# Patient Record
Sex: Female | Born: 1965 | Race: White | Hispanic: No | Smoking: Former smoker
Health system: Southern US, Community
[De-identification: ages and names within clinical notes are randomized; demographics above are authoritative.]

## PROBLEM LIST (undated history)

## (undated) DIAGNOSIS — Z6841 Body Mass Index (BMI) 40.0 and over, adult: Secondary | ICD-10-CM

## (undated) DIAGNOSIS — K589 Irritable bowel syndrome without diarrhea: Secondary | ICD-10-CM

## (undated) DIAGNOSIS — R112 Nausea with vomiting, unspecified: Secondary | ICD-10-CM

## (undated) DIAGNOSIS — R42 Dizziness and giddiness: Secondary | ICD-10-CM

## (undated) DIAGNOSIS — IMO0002 Reserved for concepts with insufficient information to code with codable children: Secondary | ICD-10-CM

## (undated) DIAGNOSIS — F32A Depression, unspecified: Secondary | ICD-10-CM

## (undated) DIAGNOSIS — Z9889 Other specified postprocedural states: Secondary | ICD-10-CM

## (undated) DIAGNOSIS — H269 Unspecified cataract: Secondary | ICD-10-CM

## (undated) DIAGNOSIS — R51 Headache: Secondary | ICD-10-CM

## (undated) DIAGNOSIS — K219 Gastro-esophageal reflux disease without esophagitis: Secondary | ICD-10-CM

## (undated) DIAGNOSIS — F419 Anxiety disorder, unspecified: Secondary | ICD-10-CM

## (undated) DIAGNOSIS — F329 Major depressive disorder, single episode, unspecified: Secondary | ICD-10-CM

## (undated) DIAGNOSIS — D649 Anemia, unspecified: Secondary | ICD-10-CM

## (undated) DIAGNOSIS — M797 Fibromyalgia: Secondary | ICD-10-CM

## (undated) DIAGNOSIS — G4730 Sleep apnea, unspecified: Secondary | ICD-10-CM

## (undated) DIAGNOSIS — G4700 Insomnia, unspecified: Secondary | ICD-10-CM

## (undated) DIAGNOSIS — T7840XA Allergy, unspecified, initial encounter: Secondary | ICD-10-CM

## (undated) DIAGNOSIS — F988 Other specified behavioral and emotional disorders with onset usually occurring in childhood and adolescence: Secondary | ICD-10-CM

## (undated) DIAGNOSIS — R519 Headache, unspecified: Secondary | ICD-10-CM

## (undated) DIAGNOSIS — D689 Coagulation defect, unspecified: Secondary | ICD-10-CM

## (undated) DIAGNOSIS — M1990 Unspecified osteoarthritis, unspecified site: Secondary | ICD-10-CM

## (undated) HISTORY — DX: Morbid (severe) obesity due to excess calories: E66.01

## (undated) HISTORY — PX: INNER EAR SURGERY: SHX679

## (undated) HISTORY — PX: OTHER SURGICAL HISTORY: SHX169

## (undated) HISTORY — PX: COLONOSCOPY: SHX174

## (undated) HISTORY — DX: Dizziness and giddiness: R42

## (undated) HISTORY — DX: Anemia, unspecified: D64.9

## (undated) HISTORY — DX: Depression, unspecified: F32.A

## (undated) HISTORY — DX: Body Mass Index (BMI) 40.0 and over, adult: Z684

## (undated) HISTORY — PX: TONSILLECTOMY: SUR1361

## (undated) HISTORY — DX: Anxiety disorder, unspecified: F41.9

## (undated) HISTORY — PX: ABLATION: SHX5711

## (undated) HISTORY — DX: Allergy, unspecified, initial encounter: T78.40XA

## (undated) HISTORY — DX: Unspecified osteoarthritis, unspecified site: M19.90

## (undated) HISTORY — PX: ABDOMINAL HYSTERECTOMY: SHX81

## (undated) HISTORY — DX: Reserved for concepts with insufficient information to code with codable children: IMO0002

## (undated) HISTORY — PX: ANKLE SURGERY: SHX546

## (undated) HISTORY — DX: Sleep apnea, unspecified: G47.30

## (undated) HISTORY — DX: Coagulation defect, unspecified: D68.9

## (undated) HISTORY — DX: Major depressive disorder, single episode, unspecified: F32.9

## (undated) HISTORY — DX: Unspecified cataract: H26.9

## (undated) HISTORY — PX: FRACTURE SURGERY: SHX138

## (undated) HISTORY — PX: TUBAL LIGATION: SHX77

---

## 2005-12-01 ENCOUNTER — Emergency Department (HOSPITAL_COMMUNITY): Admission: EM | Admit: 2005-12-01 | Discharge: 2005-12-01 | Payer: Self-pay | Admitting: Family Medicine

## 2006-01-23 ENCOUNTER — Ambulatory Visit (HOSPITAL_COMMUNITY): Admission: RE | Admit: 2006-01-23 | Discharge: 2006-01-23 | Payer: Self-pay | Admitting: Pulmonary Disease

## 2007-01-17 ENCOUNTER — Emergency Department (HOSPITAL_COMMUNITY): Admission: EM | Admit: 2007-01-17 | Discharge: 2007-01-17 | Payer: Self-pay | Admitting: Emergency Medicine

## 2010-04-01 ENCOUNTER — Other Ambulatory Visit
Admission: RE | Admit: 2010-04-01 | Discharge: 2010-04-01 | Payer: Self-pay | Source: Home / Self Care | Admitting: Obstetrics and Gynecology

## 2010-05-01 ENCOUNTER — Encounter: Payer: Self-pay | Admitting: Obstetrics and Gynecology

## 2010-05-01 ENCOUNTER — Encounter: Payer: Self-pay | Admitting: Pulmonary Disease

## 2010-06-14 ENCOUNTER — Emergency Department (HOSPITAL_COMMUNITY)
Admission: EM | Admit: 2010-06-14 | Discharge: 2010-06-15 | Disposition: A | Discharge status: Home / Self Care | Payer: Medicaid Other | Attending: Emergency Medicine | Admitting: Emergency Medicine

## 2010-06-14 ENCOUNTER — Emergency Department (HOSPITAL_COMMUNITY): Payer: Medicaid Other

## 2010-06-14 DIAGNOSIS — Y92009 Unspecified place in unspecified non-institutional (private) residence as the place of occurrence of the external cause: Secondary | ICD-10-CM | POA: Insufficient documentation

## 2010-06-14 DIAGNOSIS — S60229A Contusion of unspecified hand, initial encounter: Secondary | ICD-10-CM | POA: Insufficient documentation

## 2010-06-14 DIAGNOSIS — Z79899 Other long term (current) drug therapy: Secondary | ICD-10-CM | POA: Insufficient documentation

## 2010-06-14 DIAGNOSIS — M79609 Pain in unspecified limb: Secondary | ICD-10-CM | POA: Insufficient documentation

## 2010-06-14 DIAGNOSIS — W010XXA Fall on same level from slipping, tripping and stumbling without subsequent striking against object, initial encounter: Secondary | ICD-10-CM | POA: Insufficient documentation

## 2010-07-01 ENCOUNTER — Other Ambulatory Visit (HOSPITAL_COMMUNITY): Payer: Self-pay

## 2010-07-02 ENCOUNTER — Encounter (HOSPITAL_COMMUNITY): Payer: Medicaid Other

## 2010-07-02 ENCOUNTER — Other Ambulatory Visit: Payer: Self-pay | Admitting: Obstetrics and Gynecology

## 2010-07-02 LAB — COMPREHENSIVE METABOLIC PANEL
ALT: 21 U/L (ref 0–35)
Albumin: 3 g/dL — ABNORMAL LOW (ref 3.5–5.2)
Alkaline Phosphatase: 85 U/L (ref 39–117)
BUN: 5 mg/dL — ABNORMAL LOW (ref 6–23)
Chloride: 102 mEq/L (ref 96–112)
Glucose, Bld: 133 mg/dL — ABNORMAL HIGH (ref 70–99)
Potassium: 4.2 mEq/L (ref 3.5–5.1)
Sodium: 135 mEq/L (ref 135–145)
Total Bilirubin: 0.4 mg/dL (ref 0.3–1.2)
Total Protein: 5.8 g/dL — ABNORMAL LOW (ref 6.0–8.3)

## 2010-07-02 LAB — CBC
Hemoglobin: 13.5 g/dL (ref 12.0–15.0)
MCH: 29.9 pg (ref 26.0–34.0)
MCV: 86.3 fL (ref 78.0–100.0)
Platelets: 284 10*3/uL (ref 150–400)
RBC: 4.51 MIL/uL (ref 3.87–5.11)
WBC: 6.8 10*3/uL (ref 4.0–10.5)

## 2010-07-02 LAB — SURGICAL PCR SCREEN
MRSA, PCR: NEGATIVE
Staphylococcus aureus: NEGATIVE

## 2010-07-04 NOTE — H&P (Addendum)
Annette Herrera, Annette Herrera               ACCOUNT NO.:  [id number]  MEDICAL RECORD NO.:  [id number]           PATIENT TYPE:  LOCATION:                                 FACILITY:  PHYSICIAN:  Tilda Burrow, M.D. DATE OF BIRTH:  Nov 25, 1965  DATE OF ADMISSION: DATE OF DISCHARGE:  LH                             HISTORY & PHYSICAL   ADMITTING DIAGNOSES: 1. Desire for elective permanent sterilization. 2. Expired Implanon, scheduled for removal. 3. History of menorrhagia, desiring endometrial ablation.  HISTORY OF PRESENT ILLNESS:  This 45 year old female gravida 4, para 2-0- 1-2 is admitted at this time for elective permanent sterilization by Falope-Rings to be accompanied by Implanon removal and endometrial ablation.  Annette Herrera has been seen in our office recently for annual exam.  She has been using an Implanon for the last 3 years with 3-year expiration date, February 2012.  After discussion treatment options, she desires permanent sterilization rather than use of Mirena IUD or otheralternative methods.  She has a concern due to history of heavy menses and after discussion, desires to proceed with endometrial ablation concurrently.  Cycles are currently lasting about 5 days with severe cramps even when on Implanon during 3 years.  Recently, menses are increased in severity and discomfort.  Transvaginal ultrasound has been performed in the office to assess the endometrium.  The endometrial stripe is only 2 mm in thickness, anteflexed, otherwise normal size uterus and small ovaries.  Endometrial biopsy is therefore deferred.  The patient understands the permanency of requested sterilization, and this is her desire.  PAST MEDICAL HISTORY:  Positive for gestational diabetes, diet controlled.  SURGICAL HISTORY:  Tonsillectomy, mastoidectomy, right ankle surgery, finger surgery.  She has questionable history of fibromyalgia. Hemoglobin A1c remains good at 6.1.  FAMILY HISTORY:  Positive  for hypertension, diabetes, thyroid problems, and cancer.  SOCIAL HISTORY:  She is nonsmoker and does drink alcohol.  Denies recreational drugs.  She is self-employed.  REVIEW OF SYSTEMS:  Negative, other than chronic achy discomfort.  PHYSICAL EXAMINATION:  GENERAL:  An obese Caucasian female, weight 311.86 pounds from over the last 2 months.  BMI is still greater than 50.  Height 65 inches.  Pulse 65, blood pressure 130/80. HEENT:  Pupils equal, round, and reactive. NECK:  Supple. CHEST:  Clear to auscultation. ABDOMEN:  Nontender. BREASTS:  Deferred at this time, recently normal on annual exam. LUNGS:  Clear. ABDOMEN:  Obese without masses. EXTERNAL GENITALIA:  Multiparous.  Vaginal exam, normal uterus with normal size on difficulty exam limited by the patient's obese body habitus.  Adnexa nontender.  Recent ultrasound shows an anteflexed uterus, thin endometrial stripe, 2 mm with no adnexal abnormalities or tenderness.  IMPRESSION: 1. Desire for elective permanent sterilization. 2. Desire for Implanon removal. 3. Menorrhagia, desiring endometrial ablation. 4. Morbid obesity, BMI greater than 50.  PLAN:  Laparoscopic tubal sterilization, Falope-Rings, endometrial ablation, Implanon removal, July 06, 2010.     Tilda Burrow, M.D.     JVF/MEDQ  D:  06/30/2010  T:  06/30/2010  Job:  [id number]  cc:   [facility]  Electronically Signed by  Christin Bach M.D. on 07/04/2010 09:06:00 PM

## 2010-07-05 ENCOUNTER — Ambulatory Visit (HOSPITAL_COMMUNITY)
Admission: RE | Admit: 2010-07-05 | Discharge: 2010-07-05 | Disposition: A | Discharge status: Home / Self Care | Payer: Medicaid Other | Source: Ambulatory Visit | Attending: Obstetrics and Gynecology | Admitting: Obstetrics and Gynecology

## 2010-07-05 DIAGNOSIS — Z4689 Encounter for fitting and adjustment of other specified devices: Secondary | ICD-10-CM | POA: Insufficient documentation

## 2010-07-05 DIAGNOSIS — N920 Excessive and frequent menstruation with regular cycle: Secondary | ICD-10-CM | POA: Insufficient documentation

## 2010-07-05 DIAGNOSIS — Z01812 Encounter for preprocedural laboratory examination: Secondary | ICD-10-CM | POA: Insufficient documentation

## 2010-07-05 DIAGNOSIS — Z302 Encounter for sterilization: Secondary | ICD-10-CM | POA: Insufficient documentation

## 2010-07-19 NOTE — Op Note (Signed)
NAMEGIULIETTA, Annette Herrera               ACCOUNT NO.:  [id number]  MEDICAL RECORD NO.:  [id number]           PATIENT TYPE:  O  LOCATION:  [facility]                          FACILITY:  [facility]  PHYSICIAN:  Tilda Burrow, M.D. DATE OF BIRTH:  [birth year]  DATE OF PROCEDURE:  07/05/2010 DATE OF DISCHARGE:                              OPERATIVE REPORT   PREOPERATIVE DIAGNOSES: 1. Desire for elective permanent sterilization. 2. History of menorrhagia. 3. Implanon expired, for scheduled removal. 4. Morbid obesity.  POSTOPERATIVE DIAGNOSES: 1. Desire for elective permanent sterilization. 2. History of menorrhagia. 3. Implanon expired, for scheduled removal. 4. Midline uterine septum, resected.  PROCEDURE: 1. Laparoscopic tubal sterilization Falope rings. 2. Hysteroscopy with resection of midline intrauterine septum     endometrial ablation. 3. Removal of Implanon right upper arm.  SURGEON:  Tilda Burrow, MD ASSISTANT:  Amie Critchley, CSTANESTHESIA:  General. COMPLICATIONS:  None. FINDINGS:  Normal tubes and ovaries.  DETAILS OF PROCEDURE:  Part I The patient was taken to the operating room, prepped and draped in the usual fashion for combined abdominal and vaginal procedure with the Hulka tenaculum attached to the uterus for manipulation.  Attention was first directed to the abdomen where an infraumbilical vertical 1-cm skin incision was made as well as a suprapubic 1-cm incision.  Catheter had been placed in the bladder temporarily.  Infraumbilical vertical incision was used to place Veress needle which resulted in pneumoperitoneum under 11 mmHg pressure.  The laparoscopic trocar was introduced with a 5-mm camera using direct visualization.  The intra- abdominal content showed no evidence of trauma or bleeding.  Suprapubic trocar was placed under direct visualization and an 8-mm trocar and Falope rings were applied on each tube has shown in photo documents as 1 and 2.  Marcaine was  injected in the incarcerated knuckle of tube and in the mesosalpinx beneath the tube for pain control.  Deflation of the abdomen followed, with subcuticular 4-0 Vicryl, closure of the skin after placement of 120 mL of saline to help with evacuation of the gas from the abdomen.  Part II Hysteroscopy and endometrial ablation:  The uterus was sounded to 8 cm, dilated to 25-French, allowing introduction of rigid 30-degree hysteroscope.  Photos 3 and 4 showed the tubal ostia on what appeared initially to be the side of a septate uterus.  On further photos we may be able to see that there was a midline septum and the lower portion of the uterus.  This was transected and photo 8 shows the resected results. Endometrial cavity was very thinned and denuded with no residual tissue and no areas of suspicion.  There was no tissue sample once with curettage.  There was no tissue sample.  Gynecare ThermaChoice III endometrial ablation device was inserted, insufflated with 8 mL of D5W and 8-minute thermal ablation sequence completed and fluid recovered.  Marcaine was injected around the cervix.  Paracervical block at 3 and 9 o'clock using 22 mL with 0.52% Marcaine.  Procedure was completed and instruments were removed.  Part III Implanon was removed in sterile fashion from the right arm without difficulty through a small  5 mm incision, after prepping and draping of the arm in standard and surgical fashion Steri-Strips were placed to all incisions and the patient went to recovery room in good condition.     Tilda Burrow, M.D.     JVF/MEDQ  D:  07/05/2010  T:  07/05/2010  Job:  [id number]  Electronically Signed by Christin Bach M.D. on 07/19/2010 05:27:32 PM

## 2010-08-27 NOTE — Procedures (Signed)
[name], Annette Herrera               ACCOUNT NO.:  [id number]   MEDICAL RECORD NO.:  [id number]          PATIENT TYPE:  OUT   LOCATION:  [facility]                           FACILITY:  [facility]   PHYSICIAN:  Edward L. Juanetta Gosling, M.D.DATE OF BIRTH:  1965/06/09   DATE OF PROCEDURE:  DATE OF DISCHARGE:  01/23/2006                              PULMONARY FUNCTION TEST   1. Spirometry showed essentially normal with mild airflow obstruction seen      in the smaller airways.  2. Lung volumes are normal.  3. VLCO is mildly reduced.      Edward L. Juanetta Gosling, M.D.  Electronically Signed     ELH/MEDQ  D:  01/24/2006  T:  01/26/2006  Job:  [id number]

## 2013-04-27 ENCOUNTER — Emergency Department (HOSPITAL_COMMUNITY)
Admission: EM | Admit: 2013-04-27 | Discharge: 2013-04-27 | Disposition: A | Discharge status: Home / Self Care | Payer: Medicaid Other | Attending: Emergency Medicine | Admitting: Emergency Medicine

## 2013-04-27 ENCOUNTER — Encounter (HOSPITAL_COMMUNITY): Payer: Self-pay | Admitting: Emergency Medicine

## 2013-04-27 DIAGNOSIS — K0889 Other specified disorders of teeth and supporting structures: Secondary | ICD-10-CM

## 2013-04-27 DIAGNOSIS — Z791 Long term (current) use of non-steroidal anti-inflammatories (NSAID): Secondary | ICD-10-CM | POA: Insufficient documentation

## 2013-04-27 DIAGNOSIS — K089 Disorder of teeth and supporting structures, unspecified: Secondary | ICD-10-CM | POA: Insufficient documentation

## 2013-04-27 MED ORDER — IBUPROFEN 800 MG PO TABS: 800.0000 mg | ORAL_TABLET | Freq: Three times a day (TID) | ORAL | Status: DC

## 2013-04-27 MED ORDER — OXYCODONE-ACETAMINOPHEN 5-325 MG PO TABS: 2.0000 | ORAL_TABLET | ORAL | Status: DC | PRN

## 2013-04-27 MED ORDER — PENICILLIN V POTASSIUM 500 MG PO TABS: 500.0000 mg | ORAL_TABLET | Freq: Four times a day (QID) | ORAL | Status: AC

## 2013-04-27 NOTE — ED Provider Notes (Signed)
CSN: [id number]     Arrival date & time 04/27/13  0244 History   First MD Initiated Contact with Patient 04/27/13 [phone]     Chief Complaint  Patient presents with  . Dental Pain   (Consider location/radiation/quality/duration/timing/severity/associated sxs/prior Treatment) HPI History provided by patient. Right upper dental pain on and off for the last few weeks, worse over the last few days. No fevers or chills. No difficulty breathing or swallowing. Pain is sharp in quality and moderate to severe, worse with chewing. Patient had an old prescription for amoxicillin which she took tonight. She's been taking Motrin at home without relief. Unable to sleep tonight. Has been trying to get a hold of a dentist but does not have one. No known alleviating factors.  History reviewed. No pertinent past medical history. Past Surgical History  Procedure Laterality Date  . Tonsillectomy    . Ablation     No family history on file. History  Substance Use Topics  . Smoking status: Never Smoker   . Smokeless tobacco: Not on file  . Alcohol Use: Yes   OB History   Grav Para Term Preterm Abortions TAB SAB Ect Mult Living                 Review of Systems  Constitutional: Negative for fever and chills.  HENT: Positive for dental problem. Negative for ear pain and facial swelling.   Eyes: Negative for pain.  Respiratory: Negative for shortness of breath.   Cardiovascular: Negative for chest pain.  Gastrointestinal: Negative for abdominal pain.  Genitourinary: Negative for dysuria.  Musculoskeletal: Negative for back pain, neck pain and neck stiffness.  Skin: Negative for rash.  Neurological: Negative for headaches.  All other systems reviewed and are negative.    Allergies  Review of patient's allergies indicates no known allergies.  Home Medications   Current Outpatient Rx  Name  Route  Sig  Dispense  Refill  . ibuprofen (ADVIL,MOTRIN) 200 MG tablet   Oral   Take 200 mg by mouth  every 6 (six) hours as needed.         . naproxen (NAPROSYN) 250 MG tablet   Oral   Take by mouth 2 (two) times daily with a meal.          BP 149/82  Pulse 72  Temp(Src) 97.9 F (36.6 C) (Oral)  Resp 26  SpO2 100%  LMP 04/07/2013 Physical Exam  Constitutional: She is oriented to person, place, and time. She appears well-developed and well-nourished.  HENT:  Head: Normocephalic and atraumatic.  Uvula midline. No trismus. Poor dentition. Tenderness right upper first molar without associated gingival swelling or fluctuance. No associated facial swelling or erythema. No tenderness over maxillary sinuses  Eyes: EOM are normal. Pupils are equal, round, and reactive to light.  Neck: Neck supple.  Cardiovascular: Regular rhythm and intact distal pulses.   Pulmonary/Chest: Effort normal. No respiratory distress.  Musculoskeletal: Normal range of motion. She exhibits no edema.  Neurological: She is alert and oriented to person, place, and time.  Skin: Skin is warm and dry.    ED Course  Dental Date/Time: 04/27/2013 4:04 AM Performed by: Sunnie Nielsen Authorized by: Sunnie Nielsen Consent: Verbal consent obtained. Risks and benefits: risks, benefits and alternatives were discussed Consent given by: patient Patient understanding: patient states understanding of the procedure being performed Patient consent: the patient's understanding of the procedure matches consent given Procedure consent: procedure consent matches procedure scheduled Required items: required blood products, implants,  devices, and special equipment available Patient identity confirmed: verbally with patient Time out: Immediately prior to procedure a "time out" was called to verify the correct patient, procedure, equipment, support staff and site/side marked as required. Local anesthesia used: yes Anesthesia: local infiltration Local anesthetic: bupivacaine 0.5% without epinephrine Anesthetic total: 1.8 ml Patient  tolerance: Patient tolerated the procedure well with no immediate complications. Comments: Local injection of right upper first molar with adequate anesthesia obtained   (including critical care time) Labs Review Labs Reviewed - No data to display Imaging Review No results found.  Plan discharge home with prescription for penicillin, Motrin, Percocet. Outpatient referrals provided.  Return precautions verbalized as understood  MDM  Diagnosis: Dental pain  Treated with dental block as above Dental referral provided Vital signs nurse's notes reviewed and considered   Sunnie NielsenBrian Cortasia Screws, MD 04/27/13 0405

## 2013-04-27 NOTE — Discharge Instructions (Signed)
Dental Pain °A tooth ache may be caused by cavities (tooth decay). Cavities expose the nerve of the tooth to air and hot or cold temperatures. It may come from an infection or abscess (also called a boil or furuncle) around your tooth. It is also often caused by dental caries (tooth decay). This causes the pain you are having. °DIAGNOSIS  °Your caregiver can diagnose this problem by exam. °TREATMENT  °· If caused by an infection, it may be treated with medications which kill germs (antibiotics) and pain medications as prescribed by your caregiver. Take medications as directed. °· Only take over-the-counter or prescription medicines for pain, discomfort, or fever as directed by your caregiver. °· Whether the tooth ache today is caused by infection or dental disease, you should see your dentist as soon as possible for further care. °SEEK MEDICAL CARE IF: °The exam and treatment you received today has been provided on an emergency basis only. This is not a substitute for complete medical or dental care. If your problem worsens or new problems (symptoms) appear, and you are unable to meet with your dentist, call or return to this location. °SEEK IMMEDIATE MEDICAL CARE IF:  °· You have a fever. °· You develop redness and swelling of your face, jaw, or neck. °· You are unable to open your mouth. °· You have severe pain uncontrolled by pain medicine. °MAKE SURE YOU:  °· Understand these instructions. °· Will watch your condition. °· Will get help right away if you are not doing well or get worse. °Document Released: 03/28/2005 Document Revised: 06/20/2011 Document Reviewed: 11/14/2007 °ExitCare® Patient Information ©2014 [employer]. °  Emergency Department Resource Guide °1) Find a Doctor and Pay Out of Pocket °Although you won't have to find out who is covered by your insurance plan, it is a good idea to ask around and get recommendations. You will then need to call the office and see if the doctor you have chosen will  accept you as a new patient and what types of options they offer for patients who are self-pay. Some doctors offer discounts or will set up payment plans for their patients who do not have insurance, but you will need to ask so you aren't surprised when you get to your appointment. ° °2) Contact Your Local Health Department °Not all health departments have doctors that can see patients for sick visits, but many do, so it is worth a call to see if yours does. If you don't know where your local health department is, you can check in your phone book. The CDC also has a tool to help you locate your state's health department, and many state websites also have listings of all of their local health departments. ° °3) Find a Walk-in Clinic °If your illness is not likely to be very severe or complicated, you may want to try a walk in clinic. These are popping up all over the country in pharmacies, drugstores, and shopping centers. They're usually staffed by nurse practitioners or physician assistants that have been trained to treat common illnesses and complaints. They're usually fairly quick and inexpensive. However, if you have serious medical issues or chronic medical problems, these are probably not your best option. ° °No Primary Care Doctor: °- Call [facility] at  832-8000 - they can help you locate a primary care doctor that  accepts your insurance, provides certain services, etc. °- Physician Referral Service- 1-800-533-3463 ° °Chronic Pain Problems: °Organization           Address  Phone   Notes  °[facility]  (336) 297-2271 Patients need to be referred by their primary care doctor.  ° °Medication Assistance: °Organization         Address  Phone   Notes  °[place] [facility] [address] °[place], [place] [address] (336) 641-8030 --Must be a resident of [place] °-- Must have NO insurance coverage whatsoever (no Medicaid/ Medicare, etc.) °-- The pt.  MUST have a primary care doctor that directs their care regularly and follows them in the community °  °[facility]  (866) 331-1348   °[employer]  (888) 892-1162   ° °Agencies that provide inexpensive medical care: °Organization         Address  Phone   Notes  °[facility]  (336) 832-8035   °[facility]    (336) 832-7272   °[facility] [address] °[place], [place] [address] (336) 832-4777   °[facility] [address], °[place] (336) 271-4999   °[facility]    (336) 373-0678   °[facility]    (336) 272-1050   °[facility] ° [address], [place] Phone:  (336) 832-4444, Fax:  (336) 832-4440 Hours of Operation:  9 am - 6 pm, M-F.  Also accepts Medicaid/Medicare and self-pay.  °[facility] ° [address], [place] Phone: (336) 832-3150, Fax: (336) 832-3151. Hours of Operation:  8:30 am - 5:30 pm, M-F.  Also accepts Medicaid and self-pay.  °[facility] [place] [address], [place] Phone: (336) 878-6027   °[facility] [address], [place], [place] (336)723-1848, Ext. 123 Mondays & Thursdays: 7-9 AM.  First 15 patients are seen on a first come, first serve basis. °  ° °Medicaid-accepting [place] Providers: ° °Organization         Address  Phone   Notes  °[facility] [address], [place] (336) 641-2100 Also accepts self-pay patients.  °[facility] [address], [place] ° (336) 856-9996   °[facility] [address], [place] (336) 288-8857   °[facility] [address] [place] [address], [place] (336) 299-7000   °Veita Bland [address], [place]  ° (336) 373-1557 Only accepts [place] Access Medicaid patients after they have their name applied to their card.  ° °Self-Pay (no insurance) in  [place]: ° °Organization         Address  Phone   Notes  °Sickle Cell Patients, [facility] [address], [place] (336) 832-1970   °[facility] [address], [place] (336) 832-4400   °[facility] ° [address], [place] (336) 992-4800   °[facility]/Dr. Osei-Bonsu ° [address] [place] [address], [place] or [address], [place] (336) 841-8500 Phone number for both [place] and [place] locations is the same.  °[facility] [address], [place] (336) 299-0000   °[facility] [address] [place] [address], [place] or [address] (336) 852-7530 °(336) 878-2260   °[facility] [address], [place] (336) 350-1642, phone; (336) 294-5005, fax Sees patients 1st and 3rd Saturday of every month.  Must not qualify   for public or private insurance (i.e. Medicaid, Medicare, [place] Health Choice, Veterans' Benefits) • Household income should be no more than 200% of the poverty level •The clinic cannot treat you if you are pregnant or think you are pregnant • Sexually transmitted diseases are not treated at the clinic.  ° °Dental Care: °Organization         Address  Phone  Notes  °[facility] [facility] [address], [place] (336) 641-6152 Accepts children up to age 21 who are enrolled in Medicaid or [place] Health Choice; pregnant women with a Medicaid card; and children who have applied for Medicaid or [place] Health Choice, but were declined, whose parents can pay a reduced fee at time of service.  °[facility] [place]  [address], [place] (336) 641-7733 Accepts children up to age 21 who are enrolled in Medicaid or [place] Health Choice; pregnant women with a Medicaid card; and children who have applied for Medicaid or [place] Health Choice, but were declined, whose parents  can pay a reduced fee at time of service.  °[facility] ° [address], [place] (336) 641-4533 Patients are seen by appointment only. Walk-ins are not accepted. [facility] will see patients 18 years of age and older. °Monday - Tuesday (8am-5pm) °Most Wednesdays (8:30-5pm) °$30 per visit, cash only  °[facility] ° [address], [place] (336) 641-4533 Patients are seen by appointment only. Walk-ins are not accepted. [facility] will see patients 18 years of age and older. °One Wednesday Evening (Monthly: Volunteer Based).  $30 per visit, cash only  °[facility]  (919) 537-3737 for adults; Children under age 4, call Graduate Pediatric Dentistry at (919) [phone]. Children aged 4-14, please call (919) [phone] to request a pediatric application. ° Dental services are provided in all areas of dental care including fillings, crowns and bridges, complete and partial dentures, implants, gum treatment, root canals, and extractions. Preventive care is also provided. Treatment is provided to both adults and children. °Patients are selected via a lottery and there is often a waiting list. °  °[facility] [address], °[place] ° (336) 763-8833 [email or web address] °  °[facility] [address], [place], [place] (336)723-1848, Ext. 123 Second and Fourth Thursday of each month, opens at 6:30 AM; Clinic ends at 9 AM.  Patients are seen on a first-come first-served basis, and a limited number are seen during each clinic.  ° °[facility] ° [address], [place], [place] (336) 723-7904   Eligibility Requirements °You must have lived in [place], [place], or [place] counties for at least the last three months. °  You cannot be eligible for state or federal sponsored healthcare insurance, including Veterans Administration, Medicaid, or Medicare. °  You generally cannot be eligible for healthcare  insurance through your employer.  °  How to apply: °Eligibility screenings are held every Tuesday and Wednesday afternoon from 1:00 pm until 4:00 pm. You do not need an appointment for the interview!  °[facility] [address], [place], [place] 336-631-2330   °[facility] [facility]  336-342-8273   °[facility]  336-703-3100   °[facility]  336-570-6415   ° °Behavioral Health Resources in the Community: °Intensive Outpatient Programs °Organization         Address  Phone    Notes  °[facility] [address], [place], [place] 336-878-6098   °[facility] [address], [place], [place] 336-832-9800   °[facility] [address], [place], [place] ° 336-882-2125   °[facility] [address],  °[place], [place] 1-800-853-5163 or 336-641-4981   °Substance Abuse Resources °Organization         Address  Phone  Notes  °[facility]  336-882-2125   °[facility]  336-784-9470   °[facility]  336-285-9073   °[facility]  336-845-3988   °Residential & Outpatient Substance Abuse Program  1-800-659-3381   °Psychological Services °Organization         Address  Phone  Notes  °[facility]  336- 832-9600   °[facility]  336- 378-7881   °[facility] [address], [place] 1-800-853-5163 or 336-641-4981   ° °Mobile Crisis Teams °Organization         Address  Phone  Notes  °[facility], Mobile Crisis Care Unit  1-877-626-1772   °Assertive °[facility] ° [address] [place], [place] 336-834-9664   °Sharon DeEsch [address] °[place] 336-554-5454   ° °Self-Help/Support Groups °Organization         Address  Phone             Notes  °[facility] - variety of support groups  336- 373-1402 Call for more information  °Narcotics Anonymous (NA),  [facility] [address], °[place]  2 meetings at this location  ° °Residential Treatment Programs °Organization         Address  Phone  Notes  °[facility] [address],    °[place]  1-866-801-8205   °[facility] ° [address], [place], [place] 704-293-8524   °[facility] [address], [place] 336-845-3988 Admissions: 8am-3pm M-F  °[facility] [address]    °[place], [place] 336-841-1104   °[facility] [address], [place], [place] 336-379-7146   °[facility] [address]  °[place], [place] 336-285-9073   °[facility] - Intensive Outpatient [address], [place], [place] 336-852-3033   °[facility] [address]  °[place], [place] 1-877-615-2722 or 336-784-9470   °[facility] [address] [place], [place] 336-227-7417 Accepts Medicaid  °[facility] [address]  °[place] 1-800-659-3381 Substance Abuse/Addiction Treatment  ° °[place] Behavioral Health Resources °Organization         Address  Phone  Notes  °[facility]  (888) 581-9988   °Julie Brannon, PhD [address] [place], [place]   (336) 349-5553 or (336) 951-0000   °[facility]   [address] °[place],  (336) 349-4454   °[facility] [address], [place],  (336) 342-8316 Insurance/Medicaid/sponsorship through [facility]  °[facility] [address]                                    [place],  (336) 342-8316 Therapy/tele-psych/case  °[facility] [address]  ° [place],  (336) 349-2233    °Dr. Arfeen  (336) 349-4544   °[facility] [place]  [employer] [facility] [place]   [facility] 1) [address], [place] °2) [address], [place] °3)  [address], [place] (336) 349-3220 °(336) 342-7768 ° °(336) 342-8140     °[place] [facility] (336) 342-1394 or (336) 342-3537 (After Hours)    ° °   °

## 2013-04-27 NOTE — ED Notes (Signed)
Right upper dental pain, states she has tried to see dentist Thursday but unable to get in.

## 2013-04-27 NOTE — ED Notes (Signed)
Injected with vivacaine by ERMD, pt has full relief.

## 2013-06-17 ENCOUNTER — Telehealth: Payer: Self-pay | Admitting: Obstetrics and Gynecology

## 2013-06-17 MED ORDER — FLUOXETINE HCL 40 MG PO CAPS: 40.0000 mg | ORAL_CAPSULE | Freq: Every day | ORAL | Status: DC

## 2013-06-17 NOTE — Telephone Encounter (Signed)
refil for prozac 40 done x 30 d ref x 2

## 2013-06-17 NOTE — Telephone Encounter (Signed)
Pt requesting refill for prozac, pt does have an appt on 06/25/2013.

## 2013-06-25 ENCOUNTER — Encounter (INDEPENDENT_AMBULATORY_CARE_PROVIDER_SITE_OTHER): Payer: Self-pay

## 2013-06-25 ENCOUNTER — Encounter: Payer: Self-pay | Admitting: Obstetrics and Gynecology

## 2013-06-25 ENCOUNTER — Other Ambulatory Visit (HOSPITAL_COMMUNITY)
Admission: RE | Admit: 2013-06-25 | Discharge: 2013-06-25 | Disposition: A | Discharge status: Home / Self Care | Payer: BC Managed Care – PPO | Source: Ambulatory Visit | Attending: Obstetrics and Gynecology | Admitting: Obstetrics and Gynecology

## 2013-06-25 ENCOUNTER — Ambulatory Visit (INDEPENDENT_AMBULATORY_CARE_PROVIDER_SITE_OTHER): Payer: BC Managed Care – PPO | Admitting: Obstetrics and Gynecology

## 2013-06-25 VITALS — Ht 66.0 in | Wt 281.8 lb

## 2013-06-25 DIAGNOSIS — Z1151 Encounter for screening for human papillomavirus (HPV): Secondary | ICD-10-CM | POA: Insufficient documentation

## 2013-06-25 DIAGNOSIS — Z01419 Encounter for gynecological examination (general) (routine) without abnormal findings: Secondary | ICD-10-CM | POA: Insufficient documentation

## 2013-06-25 DIAGNOSIS — Z1212 Encounter for screening for malignant neoplasm of rectum: Secondary | ICD-10-CM

## 2013-06-25 LAB — HEMOCCULT GUIAC POC 1CARD (OFFICE): FECAL OCCULT BLD: NEGATIVE

## 2013-06-25 MED ORDER — ESCITALOPRAM OXALATE 10 MG PO TABS: 10.0000 mg | ORAL_TABLET | Freq: Every day | ORAL | Status: DC

## 2013-06-25 NOTE — Progress Notes (Signed)
Patient ID: Annette Herrera, female   DOB: 09/17/1965, 48 y.o.   MRN: [id number]  Assessment:  Annual Gyn Exam   Plan:  1. pap smear done, first in 3 yrsnext pap due 3 YR 2. return annually, return  4 wk for problem gyn for f/u dysmenorrhea s/p ablation, f/u SSRI change to lexapro 3    Annual mammogram advised 4. Colonoscopy at 51- Subjective:  Annette Herrera is a 48 y.o. female No obstetric history on file. who presents for annual exam. No LMP recorded. Patient has had an ablation. The patient has complaints today of debilitating pain at menses ,in bed x 3 days of 6 day pain cycle. Is on Prozac 40 , too sleepy,  Highly stressed, feels "crazy" x 2 wk /month, Hx PMDD.  The following portions of the patient's history were reviewed and updated as appropriate: allergies, current medications, past family history, past medical history, past social history, past surgical history and problem list.  Review of Systems Constitutional: negative, stressed, dysmenorrhea,  Gastrointestinal: negative Genitourinary: neg   Objective:  Ht 5\' 6"  (1.676 m)  Wt 281 lb 12.8 oz (127.824 kg)  BMI 45.51 kg/m2   BMI: Body mass index is 45.51 kg/(m^2).  General Appearance: Alert, appropriate appearance for age. No acute distress HEENT: Grossly normal Neck / Thyroid:  Cardiovascular: RRR; normal S1, S2, no murmur Lungs: CTA bilaterally Back: No CVAT Breast Exam: No dimpling, nipple retraction or discharge. No masses or nodes., Normal to inspection, Normal breast tissue bilaterally and No masses or nodes.No dimpling, nipple retraction or discharge. Gastrointestinal: Soft, non-tender, no masses or organomegaly Pelvic Exam: Vulva and vagina appear normal. Bimanual exam reveals normal uterus and adnexa. External genitalia: normal general appearance cx multip Ut nontender, normal ssc adx nontender, exam limited by size. Rectovaginal: normal rectal, no masses Lymphatic Exam: Non-palpable nodes in neck,  clavicular, axillary, or inguinal regions Skin: no rash or abnormalities Neurologic: Normal gait and speech, no tremor  Psychiatric: Alert and oriented, appropriate affect.  Urinalysis:Not done  Annette BachJohn Neiko Herrera. MD Pgr [phone] 2:47 PM

## 2013-06-25 NOTE — Patient Instructions (Signed)
Labs today " Please sign up for" my chart.'

## 2013-06-26 LAB — COMPREHENSIVE METABOLIC PANEL
ALT: 15 U/L (ref 0–35)
AST: 11 U/L (ref 0–37)
Albumin: 3.9 g/dL (ref 3.5–5.2)
Alkaline Phosphatase: 77 U/L (ref 39–117)
BILIRUBIN TOTAL: 0.5 mg/dL (ref 0.2–1.2)
BUN: 7 mg/dL (ref 6–23)
CO2: 31 meq/L (ref 19–32)
Calcium: 9 mg/dL (ref 8.4–10.5)
Chloride: 101 mEq/L (ref 96–112)
Creat: 0.71 mg/dL (ref 0.50–1.10)
GLUCOSE: 86 mg/dL (ref 70–99)
Potassium: 4.5 mEq/L (ref 3.5–5.3)
Sodium: 135 mEq/L (ref 135–145)
TOTAL PROTEIN: 6.5 g/dL (ref 6.0–8.3)

## 2013-06-26 LAB — CBC
HEMATOCRIT: 41.3 % (ref 36.0–46.0)
HEMOGLOBIN: 14.3 g/dL (ref 12.0–15.0)
MCH: 30.3 pg (ref 26.0–34.0)
MCHC: 34.6 g/dL (ref 30.0–36.0)
MCV: 87.5 fL (ref 78.0–100.0)
Platelets: 340 10*3/uL (ref 150–400)
RBC: 4.72 MIL/uL (ref 3.87–5.11)
RDW: 14 % (ref 11.5–15.5)
WBC: 9.6 10*3/uL (ref 4.0–10.5)

## 2013-06-26 LAB — TSH: TSH: 1.986 u[IU]/mL (ref 0.350–4.500)

## 2013-06-26 LAB — HEMOGLOBIN A1C
HEMOGLOBIN A1C: 5.6 % (ref <5.7)
Mean Plasma Glucose: 114 mg/dL (ref <117)

## 2013-06-26 LAB — LIPID PANEL
Cholesterol: 171 mg/dL (ref 0–200)
HDL: 43 mg/dL (ref >39)
LDL CALC: 108 mg/dL — AB (ref 0–99)
Total CHOL/HDL Ratio: 4 Ratio
Triglycerides: 98 mg/dL (ref <150)
VLDL: 20 mg/dL (ref 0–40)

## 2013-07-23 ENCOUNTER — Ambulatory Visit: Payer: BC Managed Care – PPO | Admitting: Obstetrics and Gynecology

## 2013-08-09 ENCOUNTER — Ambulatory Visit: Payer: BC Managed Care – PPO | Admitting: Obstetrics and Gynecology

## 2013-08-09 ENCOUNTER — Encounter: Payer: Self-pay | Admitting: *Deleted

## 2013-08-28 ENCOUNTER — Telehealth: Payer: Self-pay | Admitting: *Deleted

## 2013-08-28 MED ORDER — HYDROCODONE-ACETAMINOPHEN 10-325 MG PO TABS: 1.0000 | ORAL_TABLET | Freq: Four times a day (QID) | ORAL | Status: DC | PRN

## 2013-08-28 NOTE — Telephone Encounter (Signed)
Norco 5/325 #30 no RF for pt.  Has appt to discuss further tx for severe dysmenorrhea unsolved by endo ablation.

## 2013-08-28 NOTE — Telephone Encounter (Signed)
Pt c/o abdominal pain describes as "having labor pains" pt states OTC medication not helping. Pt states has had endometrial ablation. Pt states is going out of town for her Mothers birthday and would not be able to come in today. Pt states if Dr. Emelda FearFerguson could give RX pain medications until she can be seen.

## 2014-06-11 ENCOUNTER — Encounter: Payer: Self-pay | Admitting: Obstetrics and Gynecology

## 2014-06-11 ENCOUNTER — Other Ambulatory Visit (HOSPITAL_COMMUNITY)
Admission: RE | Admit: 2014-06-11 | Discharge: 2014-06-11 | Disposition: A | Discharge status: Home / Self Care | Payer: 59 | Source: Ambulatory Visit | Attending: Obstetrics and Gynecology | Admitting: Obstetrics and Gynecology

## 2014-06-11 ENCOUNTER — Ambulatory Visit (INDEPENDENT_AMBULATORY_CARE_PROVIDER_SITE_OTHER): Payer: 59 | Admitting: Obstetrics and Gynecology

## 2014-06-11 VITALS — BP 130/84 | HR 80 | Ht 65.0 in | Wt 289.0 lb

## 2014-06-11 DIAGNOSIS — Z113 Encounter for screening for infections with a predominantly sexual mode of transmission: Secondary | ICD-10-CM | POA: Insufficient documentation

## 2014-06-11 DIAGNOSIS — Z01419 Encounter for gynecological examination (general) (routine) without abnormal findings: Secondary | ICD-10-CM | POA: Insufficient documentation

## 2014-06-11 DIAGNOSIS — R102 Pelvic and perineal pain: Secondary | ICD-10-CM | POA: Diagnosis not present

## 2014-06-11 DIAGNOSIS — Z01818 Encounter for other preprocedural examination: Secondary | ICD-10-CM

## 2014-06-11 DIAGNOSIS — Z1151 Encounter for screening for human papillomavirus (HPV): Secondary | ICD-10-CM | POA: Diagnosis present

## 2014-06-11 NOTE — Progress Notes (Signed)
Patient ID: Annette Herrera, female   DOB: [birth year], 49 y.o.   MRN: [id number]    [facility] Visit  Patient name: Annette Herrera MRN [id number]  Date of birth: [birth year]  CC & HPI:  [name] is a 49 y.o. female presenting today for severe dysmenorrhea that has been ongoing for a couple years but which recently has become unbearable. She reports, during her last menses, she severe pain from left midline to right hip bone, abdominal distension, and very little spotty, brown bleeding. Her LMP lasted for 5 days. Today, patient describes her abdomen as "just tender." ptloses  4-10 days per month due to pelvic pain She had a uterine ablation in 2013 and had no bleeding for a couple months in 2014. 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 Patient has taken Vicodin, Motrin, and Midol with no relief. She also smoked marijuana during her periods to relieve pain and nausea. However, she has stopped doing since December 2015 because she is being scouted for work; she used to work in [employer].   Patient has not worked outside the house for some time due to her pain. Patient has a 84 year old son and 5 year old daughter, both delivered vaginally.    ROS:  Significant for no urine incontinence. All other symptoms reviewed and are negative.  Pertinent History Reviewed:   Reviewed: Significant for ablation and tubal ligation Medical         Past Medical History  Diagnosis Date  . Depression   . Anxiety                               Surgical Hx:    Past Surgical History  Procedure Laterality Date  . Tonsillectomy    . Ablation    . Tubal ligation    . Ankle surgery    . Inner ear surgery    . Left index finger     Medications: Reviewed & Updated - see associated section                      No current outpatient prescriptions on file.   Social  History: Reviewed -  reports that she has quit smoking. She has never used smokeless tobacco.  Objective Findings:  Vitals: Blood pressure 130/84, pulse 80, height  (1.651 m), weight 289 lb (131.09 kg).  Physical Examination: General appearance - alert, well appearing, and in no distress, oriented to person, place, and time and overweight Mental status - alert, oriented to person, place, and time, normal mood, behavior, speech, dress, motor activity, and thought processes, affect appropriate to mood Abdomen - tenderness noted rlq, reproduced by bimanual Pelvic - normal external genitalia, vulva, vagina, cervix, uterus and adnexa,  VULVA: normal appearing vulva with no masses, tenderness or lesions,  VAGINA: Good support, cervix is multiparous; Vaginal side wall nontender; reproducible pain; normal appearing vagina with normal color and discharge, no lesions  UTERUS: uterus is enlarged, 10-12 wks size, anteflexed, shape, consistency and moderate pain with bimanual on uterus only.  Vag sidewalls, bladder and rectum nontender.   ADNEXA: normal adnexa in size, nontender and no masses,    Assessment & Plan:   A:  1. 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 ?adenomyosis vs ut fibroids 3.  Obesity with limited descensus  of uterus.  P:  1. Schedule an US this wk  And discuss results 2. Pap cancelled. 3. Expedite move toward surgery This chart was scribed for Tilda BurrowJohn Duilio Heritage V, MD by Ronney LionSuzanne Le, ED Scribe. This patient was seen in my office, and the patient's care was started at [facility]3:24 PM.  I personally performed the services described in this documentation, which was SCRIBED in my presence. The recorded information has been reviewed and considered accurate. It has been edited as necessary during review. Tilda BurrowFERGUSON,Treniya Lobb V, MD

## 2014-06-11 NOTE — Progress Notes (Signed)
Patient ID: Annette Herrera, female   DOB: 12/08/1965, 49 y.o.   MRN: [id number] Pt here today to discuss a hysterectomy. Pt states that she has terrible terrible pain and can't function. Pt can't take it anymore.

## 2014-06-13 ENCOUNTER — Ambulatory Visit (INDEPENDENT_AMBULATORY_CARE_PROVIDER_SITE_OTHER): Payer: 59

## 2014-06-13 ENCOUNTER — Other Ambulatory Visit: Payer: Self-pay | Admitting: Obstetrics and Gynecology

## 2014-06-13 DIAGNOSIS — N9489 Other specified conditions associated with female genital organs and menstrual cycle: Secondary | ICD-10-CM

## 2014-06-13 DIAGNOSIS — R102 Pelvic and perineal pain unspecified side: Secondary | ICD-10-CM

## 2014-06-13 DIAGNOSIS — N83202 Unspecified ovarian cyst, left side: Secondary | ICD-10-CM

## 2014-06-13 DIAGNOSIS — N949 Unspecified condition associated with female genital organs and menstrual cycle: Secondary | ICD-10-CM

## 2014-06-13 DIAGNOSIS — N920 Excessive and frequent menstruation with regular cycle: Secondary | ICD-10-CM | POA: Diagnosis not present

## 2014-06-13 LAB — CYTOLOGY - PAP

## 2014-06-14 LAB — CA 125: CA 125: 71.7 U/mL — ABNORMAL HIGH (ref 0.0–34.0)

## 2014-06-16 ENCOUNTER — Ambulatory Visit: Payer: Self-pay | Admitting: Obstetrics and Gynecology

## 2014-06-16 MED ORDER — ONDANSETRON HCL 4 MG/2ML IJ SOLN
INTRAMUSCULAR | Status: AC
Start: 2014-06-16 — End: 2014-06-16
  Filled 2014-06-16: qty 2

## 2014-06-16 MED ORDER — PHENYLEPHRINE 40 MCG/ML (10ML) SYRINGE FOR IV PUSH (FOR BLOOD PRESSURE SUPPORT)
PREFILLED_SYRINGE | INTRAVENOUS | Status: AC
  Filled 2014-06-16: qty 10

## 2014-06-16 MED ORDER — LIDOCAINE HCL (CARDIAC) 20 MG/ML IV SOLN
INTRAVENOUS | Status: AC
  Filled 2014-06-16: qty 5

## 2014-06-16 MED ORDER — NEOSTIGMINE METHYLSULFATE 10 MG/10ML IV SOLN
INTRAVENOUS | Status: AC
  Filled 2014-06-16: qty 1

## 2014-06-16 MED ORDER — NEOSTIGMINE METHYLSULFATE 10 MG/10ML IV SOLN
INTRAVENOUS | Status: AC
  Filled 2014-06-16: qty 2

## 2014-06-16 MED ORDER — SODIUM CHLORIDE 0.9 % IJ SOLN
INTRAMUSCULAR | Status: AC
  Filled 2014-06-16: qty 10

## 2014-06-16 MED ORDER — ARTIFICIAL TEARS OP OINT
TOPICAL_OINTMENT | OPHTHALMIC | Status: AC
  Filled 2014-06-16: qty 3.5

## 2014-06-18 ENCOUNTER — Encounter: Payer: Self-pay | Admitting: Obstetrics and Gynecology

## 2014-06-18 ENCOUNTER — Ambulatory Visit (INDEPENDENT_AMBULATORY_CARE_PROVIDER_SITE_OTHER): Payer: 59 | Admitting: Obstetrics and Gynecology

## 2014-06-18 VITALS — BP 130/90 | Ht 65.0 in | Wt 283.0 lb

## 2014-06-18 DIAGNOSIS — N839 Noninflammatory disorder of ovary, fallopian tube and broad ligament, unspecified: Secondary | ICD-10-CM

## 2014-06-18 DIAGNOSIS — N838 Other noninflammatory disorders of ovary, fallopian tube and broad ligament: Secondary | ICD-10-CM

## 2014-06-18 NOTE — Progress Notes (Signed)
Patient ID: Annette Herrera Aveni, female   DOB: 01/29/1966, 49 y.o.   MRN: [id number]  This chart was scribed for Tilda BurrowJohn Rosealyn Little V, MD by Ronney LionSuzanne Le, ED Scribe. This patient was seen in my office and the patient's care was started at 2:11 PM.    [facility]  Patient name: Annette Herrera Cartelli MRN [id number]  Date of birth: 01/03/1966  CC & HPI:  Annette Herrera Kreiter is a 49 y.o. female presenting today for discussion of a hysterectomy, following up with US transvaginal results positive for Complex cystic mass in left adnexa, with area suspicious for mural nodularity, and septations measuring up to 5 mm thick, per chart review. Patient was seen by me for 1 week ago, on 06/11/14, for severe pelvic pain/dysmenorrhea.  Patient reports a CA-125 blood test level of around 70. She states she has been attending support groups for her situation and has been doing self-research about her condition. She is concerned because she has been eating very little and has defecated in her pants 3 times in the past month.      ROS:  Negative except for dysmenorrhea  X years, and fecal incontinence x 1  Pertinent History Reviewed:   Reviewed: Significant for ablation and tubal ligation Medical         Past Medical History  Diagnosis Date  . Depression   . Anxiety                               Surgical Hx:    Past Surgical History  Procedure Laterality Date  . Tonsillectomy    . Ablation    . Tubal ligation    . Ankle surgery    . Inner ear surgery    . Left index finger     Medications: Reviewed & Updated - see associated section                      No current outpatient prescriptions on file.   Social History: Reviewed -  reports that she has quit smoking. She has never used smokeless tobacco.  Objective Findings:  Vitals: Blood pressure 130/90, height 5\' 5"  (1.651 m), weight 283 lb (128.368 kg), last menstrual period 05/30/2014.  Physical Examination:  Patient here to discuss hysterectomy only.   2:20 PM - Discussed for about 30 minutes patient's options for surgery. Answered all questions patient had.    Assessment & Plan:   A:  1. OVarian mass, left 2  Diffuse anxiety.   P:  1. [facility]  I personally performed the services described in this documentation, which was SCRIBED in my presence. The recorded information has been reviewed and considered accurate. It has been edited as necessary during review. Tilda BurrowFERGUSON,Gregor Dershem V, MD

## 2014-06-18 NOTE — Patient Instructions (Signed)
Your appt is with Dr Andrey Farmerossi at 12/15 PM [facility] , [facility], first [facility] . [facility] at 11.45 at [facility]. Clinic Number [phone]

## 2014-06-23 ENCOUNTER — Ambulatory Visit: Payer: 59 | Attending: Gynecologic Oncology | Admitting: Gynecologic Oncology

## 2014-06-23 ENCOUNTER — Encounter: Payer: Self-pay | Admitting: Gynecologic Oncology

## 2014-06-23 VITALS — BP 120/96 | HR 70 | Temp 98.3°F | Resp 18 | Ht 65.0 in | Wt 290.5 lb

## 2014-06-23 DIAGNOSIS — R102 Pelvic and perineal pain: Secondary | ICD-10-CM | POA: Diagnosis not present

## 2014-06-23 DIAGNOSIS — Z79891 Long term (current) use of opiate analgesic: Secondary | ICD-10-CM | POA: Insufficient documentation

## 2014-06-23 DIAGNOSIS — N949 Unspecified condition associated with female genital organs and menstrual cycle: Secondary | ICD-10-CM

## 2014-06-23 DIAGNOSIS — N8320 Unspecified ovarian cysts: Secondary | ICD-10-CM | POA: Diagnosis present

## 2014-06-23 DIAGNOSIS — Z6841 Body Mass Index (BMI) 40.0 and over, adult: Secondary | ICD-10-CM | POA: Diagnosis not present

## 2014-06-23 DIAGNOSIS — G8929 Other chronic pain: Secondary | ICD-10-CM

## 2014-06-23 DIAGNOSIS — N83202 Unspecified ovarian cyst, left side: Secondary | ICD-10-CM | POA: Insufficient documentation

## 2014-06-23 MED ORDER — OXYCODONE-ACETAMINOPHEN 5-325 MG PO TABS: 2.0000 | ORAL_TABLET | ORAL | Status: DC | PRN

## 2014-06-23 NOTE — Patient Instructions (Signed)
Preparing for your Surgery  Plan for surgery on April 21 with Dr. Rossi.  Pre-operative Testing -You will receive a phone call from presurgical testing at [facility] to arrange for a pre-operative testing appointment before your surgery.  This appointment normally occurs one to two weeks before your scheduled surgery.   -Bring your insurance card, copy of an advanced directive if applicable, medication list  -At that visit, you will be asked to sign a consent for a possible blood transfusion in case a transfusion becomes necessary during surgery.  The need for a blood transfusion is rare but having consent is a necessary part of your care.     -You should not be taking blood thinners or aspirin at least ten days prior to surgery unless instructed by your surgeon.  Day Before Surgery at Home -You will be asked to take in only clear liquids the day before surgery.  Examples of clear liquids include broths, jello, and clear juices.  You will be advised to have nothing to eat or drink after midnight the evening before.    Your role in recovery Your role is to become active as soon as directed by your doctor, while still giving yourself time to heal.  Rest when you feel tired. You will be asked to do the following in order to speed your recovery:  - Cough and breathe deeply. This helps toclear and expand your lungs and can prevent pneumonia. You may be given a spirometer to practice deep breathing. A staff member will show you how to use the spirometer. - Do mild physical activity. Walking or moving your legs help your circulation and body functions return to normal. A staff member will help you when you try to walk and will provide you with simple exercises. Do not try to get up or walk alone the first time. - Actively manage your pain. Managing your pain lets you move in comfort. We will ask you to rate your pain on a scale of zero to 10. It is your responsibility to tell  your doctor or nurse where and how much you hurt so your pain can be treated.  Special Considerations -If you are diabetic, you may be placed on insulin after surgery to have closer control over your blood sugars to promote healing and recovery.  This does not mean that you will be discharged on insulin.  If applicable, your oral antidiabetics will be resumed when you are tolerating a solid diet.  -Your final pathology results from surgery should be available by the Friday after surgery and the results will be relayed to you when available.  Blood Transfusion Information WHAT IS A BLOOD TRANSFUSION? A transfusion is the replacement of blood or some of its parts. Blood is made up of multiple cells which provide different functions.  Red blood cells carry oxygen and are used for blood loss replacement.  White blood cells fight against infection.  Platelets control bleeding.  Plasma helps clot blood.  Other blood products are available for specialized needs, such as hemophilia or other clotting disorders. BEFORE THE TRANSFUSION  Who gives blood for transfusions?   You may be able to donate blood to be used at a later date on yourself (autologous donation).  Relatives can be asked to donate blood. This is generally not any safer than if you have received blood from a stranger. The same precautions are taken to ensure safety when a relative's blood is donated.  Healthy volunteers who are fully   evaluated to make sure their blood is safe. This is blood bank blood. Transfusion therapy is the safest it has ever been in the practice of medicine. Before blood is taken from a donor, a complete history is taken to make sure that person has no history of diseases nor engages in risky social behavior (examples are intravenous drug use or sexual activity with multiple partners). The donor's travel history is screened to minimize risk of transmitting infections, such as malaria. The donated blood is  tested for signs of infectious diseases, such as HIV and hepatitis. The blood is then tested to be sure it is compatible with you in order to minimize the chance of a transfusion reaction. If you or a relative donates blood, this is often done in anticipation of surgery and is not appropriate for emergency situations. It takes many days to process the donated blood. RISKS AND COMPLICATIONS Although transfusion therapy is very safe and saves many lives, the main dangers of transfusion include:   Getting an infectious disease.  Developing a transfusion reaction. This is an allergic reaction to something in the blood you were given. Every precaution is taken to prevent this. The decision to have a blood transfusion has been considered carefully by your caregiver before blood is given. Blood is not given unless the benefits outweigh the risks.     

## 2014-06-23 NOTE — Progress Notes (Signed)
Consult Note: Gyn-Onc  Consult was requested by Dr. Christin Bach for the evaluation of Annette Herrera 49 y.o. female with pelvic pain, a 12cm left ovarian mass, elevated CA 125.  CC:  Chief Complaint  Patient presents with  . Ovarian Cyst    left    Assessment/Plan:  [name]  is a 49 y.o.  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  I am recommending a robotic assisted total hysterectomy and left salpingo-oophorectomy with right salpingectomy. I do not recommend removal of a normal-appearing right ovary if benign pathology is confirmed in the left ovarian mass as the patient is premenopausal and early surgical menopause in a benign setting has been associated with lower life expectancy. I discussed with the patient that I have a low suspicion for malignancy given her young age, the symptoms of this mass being most consistent with a cyclic been on and such as endometriosis and endometrioma, and the relatively low elevation in CA-125. We will send the ovary for frozen section intraoperatively and if malignancy is identified would perform a comprehensive staging procedure or debulking as indicated.  I had an extensive discussion with Annette Herrera regarding surgical risk including  bleeding, infection, damage to internal organs (such as bladder,ureters, bowels), blood clot, reoperation and rehospitalization. I discussed that she was at an increased risk for these complications given her morbid obesity and she'll be an increased risk if she has intraperitoneal adhesions secondary to her underlying process of endometriosis. I discussed any efforts that she can make towards weight loss preoperatively will improve outcomes for her. I discussed anticipation's postoperatively including hospital stay (1 day) and anticipated recovery (4 weeks until return to work close (and postoperative restrictions on  activity. I discussed expectations regarding postoperative analgesia and pain relief. A discussed with the patient that I would be happy to prescribe analgesia for 4 weeks postoperatively but after that time, if there is no evidence for a postoperative complication, I will not be prescribing additional narcotic analgesia. I discussed that some patients with chronic pelvic pain do not have complete resolution of symptoms postoperatively, and if this is the case for Annette Herrera we will assist in referring her to her chronic pain clinic. If we leave the right ovary I discussed that there is a one in 10 risk for her requiring a subsequent surgery for an ovarian lesion.    HPI: Annette Herrera is a 49 year old gravida 2 para 2 who is seen in consultation at the request of Dr. Emelda Fear for a 12.5 x 9.0 x 8.0 cm left ovarian complex cyst. The patient has a 2-1/2 year history of cyclic severe pelvic pain. The pain is in the central pelvis and towards the right. She experiences it for approximately 46 days post cycle and is associated with abdominal bloating. To relieve the pain she uses heat, Midol (of low effect) and obtains low-dose narcotics "from the street". She reports that she tries to avoid narcotics wherever possible. She has not had health insurance for several years until recently and therefore was not able to see consultation regarding the pain. He saw her Dr. Emelda Fear in February and March the workup of this pain. A transvaginal ultrasound performed on 06/13/2014 and revealed a uterus measuring 9.3 x 6.7 x 4.7 cm, an endometrial stripe that was 11.8 mm, a normal-appearing right ovary, and the left ovary measuring 12.5 x 9.0 x 8.0 cm with a large complex cystic  mass with multiple septations noted within. A CA-125 was drawn on 06/13/2014 and was elevated at 71.7 units per milliliter.  Annette Herrera had an endometrial ablation several years ago with a tubal ligation performed at the same time. She reports the onset of  these symptoms since that time. She continues to have cyclic spotting post-ablation however no normal menstrual flow. The pain in her pelvis has been becoming progressively worse with each cycle since her endometrial ablation. She is no other history of abdominal surgery.  Current Meds:  Outpatient Encounter Prescriptions as of 06/23/2014  Medication Sig  . oxyCODONE-acetaminophen (PERCOCET) 5-325 MG per tablet Take 2 tablets by mouth every 4 (four) hours as needed for severe pain.    Allergy: No Known Allergies  Social Hx:   History   Social History  . Marital Status: Single    Spouse Name: N/A  . Number of Children: N/A  . Years of Education: N/A   Occupational History  . Not on file.   Social History Main Topics  . Smoking status: Former Games developer  . Smokeless tobacco: Never Used  . Alcohol Use: No  . Drug Use: No  . Sexual Activity: Not Currently    Birth Control/ Protection: Surgical   Other Topics Concern  . Not on file   Social History Narrative    Past Surgical Hx:  Past Surgical History  Procedure Laterality Date  . Tonsillectomy    . Ablation    . Tubal ligation    . Ankle surgery    . Inner ear surgery    . Left index finger      Past Medical Hx:  Past Medical History  Diagnosis Date  . Depression   . Anxiety   . Morbid obesity with BMI of 45.0-49.9, adult     Past Gynecological History:  G2P2 (SVD's). No hx of abnormal paps requiring intervention. S/p tubal. S/p endometrial ablation. Patient's last menstrual period was 05/30/2014.  Family Hx:  Family History  Problem Relation Age of Onset  . Diabetes Mother   . Diabetes Maternal Grandmother     Review of Systems:  Constitutional  Feels well,    ENT Normal appearing ears and nares bilaterally Skin/Breast  No rash, sores, jaundice, itching, dryness Cardiovascular  No chest pain, shortness of breath, or edema  Pulmonary  No cough or wheeze.  Gastro Intestinal  No nausea, vomitting, or  diarrhoea. No bright red blood per rectum, no abdominal pain, change in bowel movement, or constipation.  Genito Urinary  No frequency, urgency, dysuria,  Musculo Skeletal  No myalgia, arthralgia, joint swelling or pain  Neurologic  No weakness, numbness, change in gait,  Psychology  No depression, anxiety, insomnia.   Vitals:  Blood pressure 120/96, pulse 70, temperature 98.3 F (36.8 C), temperature source Oral, resp. rate 18, height  (1.651 m), weight 290 lb 8 oz (131.77 kg), last menstrual period 05/30/2014.  Physical Exam: WD in NAD Neck  Supple NROM, without any enlargements.  Lymph Node Survey No cervical supraclavicular or inguinal adenopathy Cardiovascular  Pulse normal rate, regularity and rhythm. S1 and S2 normal.  Lungs  Clear to auscultation bilateraly, without wheezes/crackles/rhonchi. Good air movement.  Skin  No rash/lesions/breakdown  Psychiatry  Alert and oriented to person, place, and time  Abdomen  Normoactive bowel sounds, abdomen soft, non-tender and obese without evidence of hernia.  Back No CVA tenderness Genito Urinary  Vulva/vagina: Normal external female genitalia.  No lesions. No discharge or bleeding.  Bladder/urethra:  No lesions or masses, well supported bladder  Vagina: normal in appearance  Cervix: Normal appearing, no lesions.  Uterus: Small, mobile, no parametrial involvement or nodularity.  Adnexa: unable to appreciate mass likely secondary to body habitus. The pelvic organs are smooth and mobile. No fixed lesions. Rectal  Good tone, no masses no cul de sac nodularity.  Extremities  No bilateral cyanosis, clubbing or edema.   Quinn Axeossi, Jesaiah Fabiano Caroline, MD   06/23/2014, 1:41 PM

## 2014-06-24 ENCOUNTER — Telehealth: Payer: Self-pay | Admitting: Obstetrics and Gynecology

## 2014-06-24 NOTE — Telephone Encounter (Signed)
Pt's plans to lose weight before surgery reinforced vigorously. Will see pt in 2 wk to weigh in here as visit.

## 2014-07-09 ENCOUNTER — Encounter: Payer: Self-pay | Admitting: Obstetrics and Gynecology

## 2014-07-09 ENCOUNTER — Ambulatory Visit (INDEPENDENT_AMBULATORY_CARE_PROVIDER_SITE_OTHER): Payer: 59 | Admitting: Obstetrics and Gynecology

## 2014-07-09 VITALS — BP 120/76 | Ht 65.0 in | Wt 280.0 lb

## 2014-07-09 DIAGNOSIS — E6609 Other obesity due to excess calories: Secondary | ICD-10-CM

## 2014-07-09 DIAGNOSIS — N839 Noninflammatory disorder of ovary, fallopian tube and broad ligament, unspecified: Secondary | ICD-10-CM

## 2014-07-09 MED ORDER — PHENTERMINE HCL 30 MG PO CAPS: 30.0000 mg | ORAL_CAPSULE | ORAL | Status: DC

## 2014-07-09 NOTE — Progress Notes (Signed)
Patient ID: Annette Herrera, female   DOB: 12/10/1965, 49 y.o.   MRN: [id number] Pt here today for follow up. Pt wants to discuss wt loss.    [facility] Visit  Patient name: Annette Herrera MRN [id number]  Date of birth: 06/14/1965  CC & HPI:  Annette Herrera is a 49 y.o. female presenting today for followup weight loss before robotic assisted hysterectomy and oophorectomy planned by Dr Andrey Farmerossi. Pt has been resisting sugary drinks.   ROS:  Pt has anxiety and depressive tendencies. Stays away from pain med, anxiolytics. Pt is having insomnia, and is prone to depression when insomnia occurs.  Has taken phentermine in the past.    Pertinent History Reviewed:   Reviewed: see hpi Medical         Past Medical History  Diagnosis Date  . Depression   . Anxiety   . Morbid obesity with BMI of 45.0-49.9, adult            Daughter is going to walk with pt                   Surgical Hx:    Past Surgical History  Procedure Laterality Date  . Tonsillectomy    . Ablation    . Tubal ligation    . Ankle surgery    . Inner ear surgery    . Left index finger     Medications: Reviewed & Updated - see associated section                       Current outpatient prescriptions:  .  oxyCODONE-acetaminophen (PERCOCET) 5-325 MG per tablet, Take 2 tablets by mouth every 4 (four) hours as needed for severe pain., Disp: 50 tablet, Rfl: 0   Social History: Reviewed -  reports that she has quit smoking. She has never used smokeless tobacco.  Objective Findings:  Vitals: Blood pressure 120/76, height 5\' 5"  (1.651 m), weight 280 lb (127.007 kg), last menstrual period 05/30/2014.  Physical Examination: General appearance - alert, well appearing, and in no distress and anxious Mental status - alert, oriented to person, place, and time   Assessment & Plan:   A:  1. Ovarian mass 2. dysmenorrhea  P:  1. Add Phentermine 30 mg til preop visit for hysterectomy,scheduled for April 21

## 2014-07-16 ENCOUNTER — Ambulatory Visit: Payer: 59 | Admitting: Obstetrics and Gynecology

## 2014-07-23 NOTE — Patient Instructions (Signed)
Your procedure is scheduled on:  07/31/14  THURSDAY  Report to [facility]-- MAIN ENTRANCE- FOLLOW SIGNS TO SHORT STAY CENTER Short Stay Center at    0530   AM.   Call this number if you have problems the morning of surgery: [phone]        Do not eat food  :After Midnight. Tuesday NIGHT--- BEGIN CLEAR LIQUID DIET Wednesday MORNING- LIQUIDS ALL DAY--- THEN NOTHING BY MOUTH AFTER MIDNIGHT WEDNESDAY NIGHT   Take these medicines the morning of surgery with A SIP OF WATER: MAY TAKE OXYCODONE IF NEEDED   .  Contacts, dentures or partial plates, or metal hairpins  can not be worn to surgery. Your family will be responsible for glasses, dentures, hearing aides while you are in surgery  Leave suitcase in the car. After surgery it may be brought to your room.  For patients admitted to the hospital, checkout time is 11:00 AM day of  discharge.         [facility] IS NOT RESPONSIBLE FOR ANY VALUABLES  Patients discharged the day of surgery will not be allowed to drive home. IF going home the day of surgery, you must have a driver and someone to stay with you for the first 24 hours                                                                                                                                          [facility] - Preparing for Surgery Before surgery, you can play an important role.  Because skin is not sterile, your skin needs to be as free of germs as possible.  You can reduce the number of germs on your skin by washing with CHG (chlorahexidine gluconate) soap before surgery.  CHG is an antiseptic cleaner which kills germs and bonds with the skin to continue killing germs even after washing. Please DO NOT use if you have an allergy to CHG or antibacterial soaps.  If your skin becomes reddened/irritated stop using the CHG and inform your nurse when you arrive at Short Stay. Do not shave (including legs and underarms) for at least 48 hours prior to the first CHG shower.   You may shave your face/neck. Please follow these instructions carefully:  1.  Shower with CHG Soap the night before surgery and the  morning of Surgery.  2.  If you choose to wash your hair, wash your hair first as usual with your  normal  shampoo.  3.  After you shampoo, rinse your hair and body thoroughly to remove the  shampoo.                           4.  Use CHG as you would any other liquid soap.  You can apply chg directly  to the skin and wash  Gently with a scrungie or clean washcloth.  5.  Apply the CHG Soap to your body ONLY FROM THE NECK DOWN.   Do not use on face/ open                           Wound or open sores. Avoid contact with eyes, ears mouth and genitals (private parts).                       Wash face,  Genitals (private parts) with your normal soap.             6.  Wash thoroughly, paying special attention to the area where your surgery  will be performed.  7.  Thoroughly rinse your body with warm water from the neck down.  8.  DO NOT shower/wash with your normal soap after using and rinsing off  the CHG Soap.                9.  Pat yourself dry with a clean towel.            10.  Wear clean pajamas.            11.  Place clean sheets on your bed the night of your first shower and do not  sleep with pets. Day of Surgery : Do not apply any lotions/deodorants the morning of surgery.  Please wear clean clothes to the hospital/surgery center.  FAILURE TO FOLLOW THESE INSTRUCTIONS MAY RESULT IN THE CANCELLATION OF YOUR SURGERY PATIENT SIGNATURE_________________________________  NURSE SIGNATURE__________________________________  ________________________________________________________________________  WHAT IS A BLOOD TRANSFUSION? Blood Transfusion Information  A transfusion is the replacement of blood or some of its parts. Blood is made up of multiple cells which provide different functions.  Red blood cells carry oxygen and are used for blood  loss replacement.  White blood cells fight against infection.  Platelets control bleeding.  Plasma helps clot blood.  Other blood products are available for specialized needs, such as hemophilia or other clotting disorders. BEFORE THE TRANSFUSION  Who gives blood for transfusions?   Healthy volunteers who are fully evaluated to make sure their blood is safe. This is blood bank blood. Transfusion therapy is the safest it has ever been in the practice of medicine. Before blood is taken from a donor, a complete history is taken to make sure that person has no history of diseases nor engages in risky social behavior (examples are intravenous drug use or sexual activity with multiple partners). The donor's travel history is screened to minimize risk of transmitting infections, such as malaria. The donated blood is tested for signs of infectious diseases, such as HIV and hepatitis. The blood is then tested to be sure it is compatible with you in order to minimize the chance of a transfusion reaction. If you or a relative donates blood, this is often done in anticipation of surgery and is not appropriate for emergency situations. It takes many days to process the donated blood. RISKS AND COMPLICATIONS Although transfusion therapy is very safe and saves many lives, the main dangers of transfusion include:  1. Getting an infectious disease. 2. Developing a transfusion reaction. This is an allergic reaction to something in the blood you were given. Every precaution is taken to prevent this. The decision to have a blood transfusion has been considered carefully by your caregiver before blood is given. Blood is not given unless the benefits outweigh  the risks. AFTER THE TRANSFUSION  Right after receiving a blood transfusion, you will usually feel much better and more energetic. This is especially true if your red blood cells have gotten low (anemic). The transfusion raises the level of the red blood cells  which carry oxygen, and this usually causes an energy increase.  The nurse administering the transfusion will monitor you carefully for complications. HOME CARE INSTRUCTIONS  No special instructions are needed after a transfusion. You may find your energy is better. Speak with your caregiver about any limitations on activity for underlying diseases you may have. SEEK MEDICAL CARE IF:   Your condition is not improving after your transfusion.  You develop redness or irritation at the intravenous (IV) site. SEEK IMMEDIATE MEDICAL CARE IF:  Any of the following symptoms occur over the next 12 hours:  Shaking chills.  You have a temperature by mouth above 102 F (38.9 C), not controlled by medicine.  Chest, back, or muscle pain.  People around you feel you are not acting correctly or are confused.  Shortness of breath or difficulty breathing.  Dizziness and fainting.  You get a rash or develop hives.  You have a decrease in urine output.  Your urine turns a dark color or changes to pink, red, or brown. Any of the following symptoms occur over the next 10 days:  You have a temperature by mouth above 102 F (38.9 C), not controlled by medicine.  Shortness of breath.  Weakness after normal activity.  The white part of the eye turns yellow (jaundice).  You have a decrease in the amount of urine or are urinating less often.  Your urine turns a dark color or changes to pink, red, or brown. Document Released: 03/25/2000 Document Revised: 06/20/2011 Document Reviewed: 11/12/2007 ExitCare Patient Information 2014 [place], [place].  _______________________________________________________________________  Incentive Spirometer  An incentive spirometer is a tool that can help keep your lungs clear and active. This tool measures how well you are filling your lungs with each breath. Taking long deep breaths may help reverse or decrease the chance of developing breathing (pulmonary)  problems (especially infection) following:  A long period of time when you are unable to move or be active. BEFORE THE PROCEDURE   If the spirometer includes an indicator to show your best effort, your nurse or respiratory therapist will set it to a desired goal.  If possible, sit up straight or lean slightly forward. Try not to slouch.  Hold the incentive spirometer in an upright position. INSTRUCTIONS FOR USE  3. Sit on the edge of your bed if possible, or sit up as far as you can in bed or on a chair. 4. Hold the incentive spirometer in an upright position. 5. Breathe out normally. 6. Place the mouthpiece in your mouth and seal your lips tightly around it. 7. Breathe in slowly and as deeply as possible, raising the piston or the ball toward the top of the column. 8. Hold your breath for 3-5 seconds or for as long as possible. Allow the piston or ball to fall to the bottom of the column. 9. Remove the mouthpiece from your mouth and breathe out normally. 10. Rest for a few seconds and repeat Steps 1 through 7 at least 10 times every 1-2 hours when you are awake. Take your time and take a few normal breaths between deep breaths. 11. The spirometer may include an indicator to show your best effort. Use the indicator as a goal to work  toward during each repetition. 12. After each set of 10 deep breaths, practice coughing to be sure your lungs are clear. If you have an incision (the cut made at the time of surgery), support your incision when coughing by placing a pillow or rolled up towels firmly against it. Once you are able to get out of bed, walk around indoors and cough well. You may stop using the incentive spirometer when instructed by your caregiver.  RISKS AND COMPLICATIONS  Take your time so you do not get dizzy or light-headed.  If you are in pain, you may need to take or ask for pain medication before doing incentive spirometry. It is harder to take a deep breath if you are having  pain. AFTER USE  Rest and breathe slowly and easily.  It can be helpful to keep track of a log of your progress. Your caregiver can provide you with a simple table to help with this. If you are using the spirometer at home, follow these instructions: SEEK MEDICAL CARE IF:   You are having difficultly using the spirometer.  You have trouble using the spirometer as often as instructed.  Your pain medication is not giving enough relief while using the spirometer.  You develop fever of 100.5 F (38.1 C) or higher. SEEK IMMEDIATE MEDICAL CARE IF:   You cough up bloody sputum that had not been present before.  You develop fever of 102 F (38.9 C) or greater.  You develop worsening pain at or near the incision site. MAKE SURE YOU:   Understand these instructions.  Will watch your condition.  Will get help right away if you are not doing well or get worse. Document Released: 08/08/2006 Document Revised: 06/20/2011 Document Reviewed: 10/09/2006 ExitCare Patient Information 2014 [employer], [place].   ________________________________________________________________________    CLEAR LIQUID DIET   Foods Allowed                                                                     Foods Excluded  Coffee and tea, regular and decaf                             liquids that you cannot  Plain Jell-O in any flavor                                             see through such as: Fruit ices (not with fruit pulp)                                     milk, soups, orange juice  Iced Popsicles                                    All solid food Carbonated beverages, regular and diet  Cranberry, grape and apple juices Sports drinks like Gatorade Lightly seasoned clear broth or consume(fat free) Sugar, honey syrup  Sample Menu Breakfast                                Lunch                                     Supper Cranberry juice                    Beef broth                             Chicken broth Jell-O                                     Grape juice                           Apple juice Coffee or tea                        Jell-O                                      Popsicle                                                Coffee or tea                        Coffee or tea  _____________________________________________________________________

## 2014-07-23 NOTE — Progress Notes (Signed)
chest x ray, ekg 1/16 chart

## 2014-07-28 ENCOUNTER — Telehealth: Payer: Self-pay | Admitting: *Deleted

## 2014-07-28 ENCOUNTER — Encounter (HOSPITAL_COMMUNITY)
Admission: RE | Admit: 2014-07-28 | Discharge: 2014-07-28 | Disposition: A | Discharge status: Home / Self Care | Payer: 59 | Source: Ambulatory Visit | Attending: Gynecologic Oncology | Admitting: Gynecologic Oncology

## 2014-07-28 ENCOUNTER — Encounter (HOSPITAL_COMMUNITY): Payer: Self-pay

## 2014-07-28 DIAGNOSIS — N801 Endometriosis of ovary: Secondary | ICD-10-CM | POA: Diagnosis not present

## 2014-07-28 DIAGNOSIS — N72 Inflammatory disease of cervix uteri: Secondary | ICD-10-CM | POA: Diagnosis not present

## 2014-07-28 DIAGNOSIS — Z9851 Tubal ligation status: Secondary | ICD-10-CM | POA: Diagnosis not present

## 2014-07-28 DIAGNOSIS — Z87891 Personal history of nicotine dependence: Secondary | ICD-10-CM | POA: Diagnosis not present

## 2014-07-28 DIAGNOSIS — R102 Pelvic and perineal pain: Secondary | ICD-10-CM | POA: Diagnosis present

## 2014-07-28 DIAGNOSIS — F419 Anxiety disorder, unspecified: Secondary | ICD-10-CM | POA: Diagnosis not present

## 2014-07-28 DIAGNOSIS — F329 Major depressive disorder, single episode, unspecified: Secondary | ICD-10-CM | POA: Diagnosis not present

## 2014-07-28 DIAGNOSIS — Z6841 Body Mass Index (BMI) 40.0 and over, adult: Secondary | ICD-10-CM | POA: Diagnosis not present

## 2014-07-28 DIAGNOSIS — N736 Female pelvic peritoneal adhesions (postinfective): Secondary | ICD-10-CM | POA: Diagnosis not present

## 2014-07-28 HISTORY — DX: Gastro-esophageal reflux disease without esophagitis: K21.9

## 2014-07-28 HISTORY — DX: Headache, unspecified: R51.9

## 2014-07-28 HISTORY — DX: Other specified postprocedural states: R11.2

## 2014-07-28 HISTORY — DX: Insomnia, unspecified: G47.00

## 2014-07-28 HISTORY — DX: Other specified behavioral and emotional disorders with onset usually occurring in childhood and adolescence: F98.8

## 2014-07-28 HISTORY — DX: Headache: R51

## 2014-07-28 HISTORY — DX: Other specified postprocedural states: Z98.890

## 2014-07-28 HISTORY — DX: Fibromyalgia: M79.7

## 2014-07-28 HISTORY — DX: Irritable bowel syndrome, unspecified: K58.9

## 2014-07-28 LAB — COMPREHENSIVE METABOLIC PANEL
ALK PHOS: 85 U/L (ref 39–117)
ALT: 42 U/L — ABNORMAL HIGH (ref 0–35)
ANION GAP: 10 (ref 5–15)
AST: 33 U/L (ref 0–37)
Albumin: 3.8 g/dL (ref 3.5–5.2)
BILIRUBIN TOTAL: 0.4 mg/dL (ref 0.3–1.2)
BUN: 8 mg/dL (ref 6–23)
CO2: 28 mmol/L (ref 19–32)
CREATININE: 0.74 mg/dL (ref 0.50–1.10)
Calcium: 9.4 mg/dL (ref 8.4–10.5)
Chloride: 104 mmol/L (ref 96–112)
GFR calc non Af Amer: >90 mL/min (ref >90)
GLUCOSE: 109 mg/dL — AB (ref 70–99)
POTASSIUM: 4.4 mmol/L (ref 3.5–5.1)
Sodium: 142 mmol/L (ref 135–145)
Total Protein: 7.7 g/dL (ref 6.0–8.3)

## 2014-07-28 LAB — URINALYSIS, ROUTINE W REFLEX MICROSCOPIC
GLUCOSE, UA: NEGATIVE mg/dL
Ketones, ur: NEGATIVE mg/dL
NITRITE: NEGATIVE
PROTEIN: NEGATIVE mg/dL
Specific Gravity, Urine: 1.03 (ref 1.005–1.030)
UROBILINOGEN UA: 0.2 mg/dL (ref 0.0–1.0)
pH: 5 (ref 5.0–8.0)

## 2014-07-28 LAB — CBC WITH DIFFERENTIAL/PLATELET
BASOS ABS: 0 10*3/uL (ref 0.0–0.1)
Basophils Relative: 1 % (ref 0–1)
Eosinophils Absolute: 0.1 10*3/uL (ref 0.0–0.7)
Eosinophils Relative: 1 % (ref 0–5)
HCT: 41.4 % (ref 36.0–46.0)
Hemoglobin: 13.7 g/dL (ref 12.0–15.0)
LYMPHS PCT: 37 % (ref 12–46)
Lymphs Abs: 2.2 10*3/uL (ref 0.7–4.0)
MCH: 29.6 pg (ref 26.0–34.0)
MCHC: 33.1 g/dL (ref 30.0–36.0)
MCV: 89.4 fL (ref 78.0–100.0)
Monocytes Absolute: 0.4 10*3/uL (ref 0.1–1.0)
Monocytes Relative: 6 % (ref 3–12)
NEUTROS PCT: 55 % (ref 43–77)
Neutro Abs: 3.3 10*3/uL (ref 1.7–7.7)
PLATELETS: 335 10*3/uL (ref 150–400)
RBC: 4.63 MIL/uL (ref 3.87–5.11)
RDW: 13.2 % (ref 11.5–15.5)
WBC: 5.9 10*3/uL (ref 4.0–10.5)

## 2014-07-28 LAB — URINE MICROSCOPIC-ADD ON

## 2014-07-28 LAB — ABO/RH: ABO/RH(D): B POS

## 2014-07-28 LAB — HCG, SERUM, QUALITATIVE: Preg, Serum: NEGATIVE

## 2014-07-28 NOTE — Progress Notes (Signed)
Faxed urine with micro to [facility] np via epic 

## 2014-07-28 NOTE — Progress Notes (Signed)
PST VISIT-- instructed patient she must obtain her own RX for xanax if she continues to take this.  Also, instructed no marijuana x 48 hrs pre op at least if she continues to use this drug

## 2014-07-28 NOTE — Telephone Encounter (Signed)
[facility]. Pt states that her anxiety is through the roof. Pt states that she needs an Rx for xanax.

## 2014-07-29 NOTE — Telephone Encounter (Signed)
Pt doesn't need to be sedated for preop. Rx not felt indicated.

## 2014-07-30 MED ORDER — CEFAZOLIN SODIUM 10 G IJ SOLR
3.0000 g | INTRAMUSCULAR | Status: AC
  Administered 2014-07-31: 3 g via INTRAVENOUS
  Filled 2014-07-30 (×2): qty 3000

## 2014-07-31 ENCOUNTER — Ambulatory Visit (HOSPITAL_COMMUNITY)
Admission: RE | Admit: 2014-07-31 | Discharge: 2014-08-01 | Disposition: A | Discharge status: Home / Self Care | Payer: 59 | Source: Ambulatory Visit | Attending: Gynecologic Oncology | Admitting: Gynecologic Oncology

## 2014-07-31 ENCOUNTER — Ambulatory Visit (HOSPITAL_COMMUNITY): Payer: 59 | Admitting: Anesthesiology

## 2014-07-31 ENCOUNTER — Encounter (HOSPITAL_COMMUNITY): Payer: Self-pay | Admitting: *Deleted

## 2014-07-31 ENCOUNTER — Encounter (HOSPITAL_COMMUNITY)
Admission: RE | Disposition: A | Discharge status: Home / Self Care | Payer: Self-pay | Source: Ambulatory Visit | Attending: Gynecologic Oncology

## 2014-07-31 DIAGNOSIS — N809 Endometriosis, unspecified: Secondary | ICD-10-CM | POA: Diagnosis not present

## 2014-07-31 DIAGNOSIS — N736 Female pelvic peritoneal adhesions (postinfective): Secondary | ICD-10-CM | POA: Insufficient documentation

## 2014-07-31 DIAGNOSIS — Z87891 Personal history of nicotine dependence: Secondary | ICD-10-CM | POA: Insufficient documentation

## 2014-07-31 DIAGNOSIS — Z6841 Body Mass Index (BMI) 40.0 and over, adult: Secondary | ICD-10-CM | POA: Insufficient documentation

## 2014-07-31 DIAGNOSIS — G8929 Other chronic pain: Secondary | ICD-10-CM | POA: Diagnosis present

## 2014-07-31 DIAGNOSIS — N803 Endometriosis of pelvic peritoneum: Secondary | ICD-10-CM

## 2014-07-31 DIAGNOSIS — N72 Inflammatory disease of cervix uteri: Secondary | ICD-10-CM | POA: Insufficient documentation

## 2014-07-31 DIAGNOSIS — N801 Endometriosis of ovary: Secondary | ICD-10-CM | POA: Diagnosis not present

## 2014-07-31 DIAGNOSIS — N83202 Unspecified ovarian cyst, left side: Secondary | ICD-10-CM | POA: Diagnosis present

## 2014-07-31 DIAGNOSIS — Z9851 Tubal ligation status: Secondary | ICD-10-CM | POA: Insufficient documentation

## 2014-07-31 DIAGNOSIS — N80109 Endometriosis of ovary, unspecified side, unspecified depth: Secondary | ICD-10-CM | POA: Diagnosis present

## 2014-07-31 DIAGNOSIS — N803C9 Endometriosis of the uterosacral ligament(s), unspecified side, unspecified depth: Secondary | ICD-10-CM

## 2014-07-31 DIAGNOSIS — R102 Pelvic and perineal pain: Secondary | ICD-10-CM

## 2014-07-31 DIAGNOSIS — F329 Major depressive disorder, single episode, unspecified: Secondary | ICD-10-CM | POA: Insufficient documentation

## 2014-07-31 DIAGNOSIS — F419 Anxiety disorder, unspecified: Secondary | ICD-10-CM | POA: Insufficient documentation

## 2014-07-31 HISTORY — PX: ROBOTIC ASSISTED LAP VAGINAL HYSTERECTOMY: SHX2362

## 2014-07-31 LAB — TYPE AND SCREEN
ABO/RH(D): B POS
Antibody Screen: NEGATIVE

## 2014-07-31 SURGERY — ROBOTIC ASSISTED LAPAROSCOPIC VAGINAL HYSTERECTOMY
Anesthesia: General | Laterality: Left

## 2014-07-31 MED ORDER — FENTANYL CITRATE (PF) 100 MCG/2ML IJ SOLN
25.0000 ug | INTRAMUSCULAR | Status: DC | PRN
  Administered 2014-07-31: 12:00:00 via INTRAVENOUS
  Administered 2014-07-31: 50 ug via INTRAVENOUS
  Administered 2014-07-31: 12:00:00 via INTRAVENOUS
  Administered 2014-07-31 (×2): 50 ug via INTRAVENOUS

## 2014-07-31 MED ORDER — PREGABALIN 75 MG PO CAPS
150.0000 mg | ORAL_CAPSULE | Freq: Once | ORAL | Status: AC
  Administered 2014-07-31: 150 mg via ORAL
  Filled 2014-07-31: qty 2

## 2014-07-31 MED ORDER — ENOXAPARIN SODIUM 40 MG/0.4ML ~~LOC~~ SOLN
40.0000 mg | SUBCUTANEOUS | Status: AC
  Administered 2014-07-31: 40 mg via SUBCUTANEOUS
  Filled 2014-07-31: qty 0.4

## 2014-07-31 MED ORDER — LACTATED RINGERS IV SOLN
INTRAVENOUS | Status: DC | PRN
  Administered 2014-07-31: 250 mL

## 2014-07-31 MED ORDER — SCOPOLAMINE 1 MG/3DAYS TD PT72
MEDICATED_PATCH | TRANSDERMAL | Status: DC | PRN
  Administered 2014-07-31: 1 via TRANSDERMAL

## 2014-07-31 MED ORDER — SCOPOLAMINE 1 MG/3DAYS TD PT72
MEDICATED_PATCH | TRANSDERMAL | Status: AC
Start: 2014-07-31 — End: 2014-07-31
  Filled 2014-07-31: qty 1

## 2014-07-31 MED ORDER — METOCLOPRAMIDE HCL 5 MG/ML IJ SOLN
INTRAMUSCULAR | Status: AC
  Filled 2014-07-31: qty 2

## 2014-07-31 MED ORDER — LACTATED RINGERS IV SOLN
INTRAVENOUS | Status: DC | PRN
  Administered 2014-07-31: 07:00:00 via INTRAVENOUS

## 2014-07-31 MED ORDER — NEOSTIGMINE METHYLSULFATE 10 MG/10ML IV SOLN
INTRAVENOUS | Status: AC
  Filled 2014-07-31: qty 1

## 2014-07-31 MED ORDER — ONDANSETRON HCL 4 MG/2ML IJ SOLN
INTRAMUSCULAR | Status: DC | PRN
Start: 2014-07-31 — End: 2014-07-31
  Administered 2014-07-31: 4 mg via INTRAVENOUS

## 2014-07-31 MED ORDER — MIDAZOLAM HCL 5 MG/5ML IJ SOLN
INTRAMUSCULAR | Status: DC | PRN
  Administered 2014-07-31: 2 mg via INTRAVENOUS

## 2014-07-31 MED ORDER — FENTANYL CITRATE (PF) 100 MCG/2ML IJ SOLN
INTRAMUSCULAR | Status: AC
  Filled 2014-07-31: qty 2

## 2014-07-31 MED ORDER — KCL IN DEXTROSE-NACL 20-5-0.45 MEQ/L-%-% IV SOLN
INTRAVENOUS | Status: DC
  Administered 2014-07-31 – 2014-08-01 (×3): via INTRAVENOUS
  Filled 2014-07-31 (×2): qty 1000

## 2014-07-31 MED ORDER — PROPOFOL 10 MG/ML IV BOLUS
INTRAVENOUS | Status: AC
  Filled 2014-07-31: qty 20

## 2014-07-31 MED ORDER — KETOROLAC TROMETHAMINE 15 MG/ML IJ SOLN
15.0000 mg | Freq: Four times a day (QID) | INTRAMUSCULAR | Status: DC | PRN
  Administered 2014-07-31 – 2014-08-01 (×3): 15 mg via INTRAVENOUS
  Filled 2014-07-31 (×3): qty 1

## 2014-07-31 MED ORDER — ENOXAPARIN SODIUM 40 MG/0.4ML ~~LOC~~ SOLN
40.0000 mg | SUBCUTANEOUS | Status: DC
  Administered 2014-08-01: 40 mg via SUBCUTANEOUS
  Filled 2014-07-31 (×2): qty 0.4

## 2014-07-31 MED ORDER — ONDANSETRON HCL 4 MG/2ML IJ SOLN
INTRAMUSCULAR | Status: AC
  Filled 2014-07-31: qty 2

## 2014-07-31 MED ORDER — PROPOFOL 10 MG/ML IV BOLUS
INTRAVENOUS | Status: DC | PRN
  Administered 2014-07-31: 200 mg via INTRAVENOUS

## 2014-07-31 MED ORDER — CISATRACURIUM BESYLATE (PF) 10 MG/5ML IV SOLN
INTRAVENOUS | Status: DC | PRN
  Administered 2014-07-31: 4 mg via INTRAVENOUS
  Administered 2014-07-31: 3 mg via INTRAVENOUS
  Administered 2014-07-31: 2 mg via INTRAVENOUS
  Administered 2014-07-31: 8 mg via INTRAVENOUS

## 2014-07-31 MED ORDER — METOCLOPRAMIDE HCL 5 MG/ML IJ SOLN
INTRAMUSCULAR | Status: DC | PRN
  Administered 2014-07-31: 10 mg via INTRAVENOUS

## 2014-07-31 MED ORDER — ONDANSETRON HCL 4 MG/2ML IJ SOLN
4.0000 mg | Freq: Four times a day (QID) | INTRAMUSCULAR | Status: DC | PRN
  Administered 2014-07-31: 4 mg via INTRAVENOUS
  Filled 2014-07-31: qty 2

## 2014-07-31 MED ORDER — DEXAMETHASONE SODIUM PHOSPHATE 10 MG/ML IJ SOLN
INTRAMUSCULAR | Status: DC | PRN
  Administered 2014-07-31: 10 mg via INTRAVENOUS

## 2014-07-31 MED ORDER — OXYCODONE-ACETAMINOPHEN 5-325 MG PO TABS: 1.0000 | ORAL_TABLET | ORAL | Status: DC | PRN

## 2014-07-31 MED ORDER — FENTANYL CITRATE (PF) 100 MCG/2ML IJ SOLN
INTRAMUSCULAR | Status: DC | PRN
  Administered 2014-07-31: 50 ug via INTRAVENOUS
  Administered 2014-07-31: 100 ug via INTRAVENOUS
  Administered 2014-07-31: 50 ug via INTRAVENOUS

## 2014-07-31 MED ORDER — LACTATED RINGERS IV SOLN
INTRAVENOUS | Status: DC
  Administered 2014-07-31: 10:00:00 via INTRAVENOUS

## 2014-07-31 MED ORDER — HYDROMORPHONE HCL 1 MG/ML IJ SOLN
0.2000 mg | INTRAMUSCULAR | Status: DC | PRN
  Administered 2014-07-31 (×2): 0.6 mg via INTRAVENOUS
  Filled 2014-07-31 (×2): qty 1

## 2014-07-31 MED ORDER — ONDANSETRON HCL 4 MG PO TABS: 4.0000 mg | ORAL_TABLET | Freq: Four times a day (QID) | ORAL | Status: DC | PRN

## 2014-07-31 MED ORDER — SUCCINYLCHOLINE CHLORIDE 20 MG/ML IJ SOLN
INTRAMUSCULAR | Status: DC | PRN
  Administered 2014-07-31: 100 mg via INTRAVENOUS

## 2014-07-31 MED ORDER — PROMETHAZINE HCL 25 MG/ML IJ SOLN: 6.2500 mg | INTRAMUSCULAR | Status: DC | PRN

## 2014-07-31 MED ORDER — FENTANYL CITRATE (PF) 250 MCG/5ML IJ SOLN
INTRAMUSCULAR | Status: AC
  Filled 2014-07-31: qty 5

## 2014-07-31 MED ORDER — ALPRAZOLAM 0.5 MG PO TABS
0.5000 mg | ORAL_TABLET | Freq: Three times a day (TID) | ORAL | Status: DC
  Administered 2014-07-31 – 2014-08-01 (×3): 0.5 mg via ORAL
  Filled 2014-07-31 (×3): qty 1

## 2014-07-31 MED ORDER — MEPERIDINE HCL 50 MG/ML IJ SOLN: 6.2500 mg | INTRAMUSCULAR | Status: DC | PRN

## 2014-07-31 MED ORDER — GLYCOPYRROLATE 0.2 MG/ML IJ SOLN
INTRAMUSCULAR | Status: DC | PRN
  Administered 2014-07-31: .8 mg via INTRAVENOUS

## 2014-07-31 MED ORDER — DEXAMETHASONE SODIUM PHOSPHATE 10 MG/ML IJ SOLN
INTRAMUSCULAR | Status: AC
  Filled 2014-07-31: qty 1

## 2014-07-31 MED ORDER — KCL IN DEXTROSE-NACL 20-5-0.45 MEQ/L-%-% IV SOLN
INTRAVENOUS | Status: AC
  Filled 2014-07-31: qty 1000

## 2014-07-31 MED ORDER — NEOSTIGMINE METHYLSULFATE 10 MG/10ML IV SOLN
INTRAVENOUS | Status: DC | PRN
  Administered 2014-07-31: 5 mg via INTRAVENOUS

## 2014-07-31 MED ORDER — CISATRACURIUM BESYLATE 20 MG/10ML IV SOLN
INTRAVENOUS | Status: AC
  Filled 2014-07-31: qty 10

## 2014-07-31 MED ORDER — GLYCOPYRROLATE 0.2 MG/ML IJ SOLN
INTRAMUSCULAR | Status: AC
  Filled 2014-07-31: qty 4

## 2014-07-31 MED ORDER — FENTANYL CITRATE (PF) 100 MCG/2ML IJ SOLN
INTRAMUSCULAR | Status: AC
Start: 2014-07-31 — End: 2014-07-31
  Filled 2014-07-31: qty 2

## 2014-07-31 MED ORDER — MIDAZOLAM HCL 2 MG/2ML IJ SOLN
INTRAMUSCULAR | Status: AC
  Filled 2014-07-31: qty 2

## 2014-07-31 SURGICAL SUPPLY — 53 items
BAG SPEC RTRVL LRG 6X4 10 (ENDOMECHANICALS) ×1
CABLE HIGH FREQUENCY MONO STRZ (ELECTRODE) IMPLANT
CHLORAPREP W/TINT 26ML (MISCELLANEOUS) ×3 IMPLANT
CORDS BIPOLAR (ELECTRODE) ×2 IMPLANT
COVER SURGICAL LIGHT HANDLE (MISCELLANEOUS) IMPLANT
COVER TIP SHEARS 8 DVNC (MISCELLANEOUS) ×1 IMPLANT
COVER TIP SHEARS 8MM DA VINCI (MISCELLANEOUS) ×1
DRAPE ARM DVNC X/XI (DISPOSABLE) IMPLANT
DRAPE COLUMN DVNC XI (DISPOSABLE) IMPLANT
DRAPE DA VINCI XI ARM (DISPOSABLE) ×4
DRAPE DA VINCI XI COLUMN (DISPOSABLE) ×1
DRAPE SHEET LG 3/4 BI-LAMINATE (DRAPES) ×4 IMPLANT
DRAPE SURG IRRIG POUCH 19X23 (DRAPES) ×2 IMPLANT
DRAPE TABLE BACK 44X90 PK DISP (DRAPES) ×4 IMPLANT
DRAPE WARM FLUID 44X44 (DRAPE) ×2 IMPLANT
DRSG TEGADERM 2-3/8X2-3/4 SM (GAUZE/BANDAGES/DRESSINGS) ×2 IMPLANT
DRSG TEGADERM 4X4.75 (GAUZE/BANDAGES/DRESSINGS) ×1 IMPLANT
DRSG TEGADERM 6X8 (GAUZE/BANDAGES/DRESSINGS) ×4 IMPLANT
ELECT REM PT RETURN 9FT ADLT (ELECTROSURGICAL) ×2
ELECTRODE REM PT RTRN 9FT ADLT (ELECTROSURGICAL) ×1 IMPLANT
GLOVE BIO SURGEON STRL SZ 6 (GLOVE) ×6 IMPLANT
GLOVE BIO SURGEON STRL SZ 6.5 (GLOVE) ×4 IMPLANT
GOWN STRL REUS W/ TWL LRG LVL3 (GOWN DISPOSABLE) ×3 IMPLANT
GOWN STRL REUS W/TWL LRG LVL3 (GOWN DISPOSABLE) ×6
HOLDER FOLEY CATH W/STRAP (MISCELLANEOUS) ×2 IMPLANT
KIT ACCESSORY DA VINCI DISP (KITS)
KIT ACCESSORY DVNC DISP (KITS) IMPLANT
KIT BASIN OR (CUSTOM PROCEDURE TRAY) ×2 IMPLANT
LIQUID BAND (GAUZE/BANDAGES/DRESSINGS) ×2 IMPLANT
MANIPULATOR UTERINE 4.5 ZUMI (MISCELLANEOUS) ×2 IMPLANT
OCCLUDER COLPOPNEUMO (BALLOONS) ×2 IMPLANT
PEN SKIN MARKING BROAD (MISCELLANEOUS) ×2 IMPLANT
POUCH SPECIMEN RETRIEVAL 10MM (ENDOMECHANICALS) ×1 IMPLANT
SEAL CANN UNIV 5-8 DVNC XI (MISCELLANEOUS) IMPLANT
SEAL XI 5MM-8MM UNIVERSAL (MISCELLANEOUS) ×4
SET TUBE IRRIG SUCTION NO TIP (IRRIGATION / IRRIGATOR) ×2 IMPLANT
SHEET LAVH (DRAPES) ×2 IMPLANT
SOLUTION ELECTROLUBE (MISCELLANEOUS) ×2 IMPLANT
SUT VIC AB 0 CT1 27 (SUTURE) ×2
SUT VIC AB 0 CT1 27XBRD ANTBC (SUTURE) ×1 IMPLANT
SUT VIC AB 4-0 PS2 27 (SUTURE) ×4 IMPLANT
SUT VICRYL 0 UR6 27IN ABS (SUTURE) ×2 IMPLANT
SYR 50ML LL SCALE MARK (SYRINGE) ×2 IMPLANT
TOWEL OR 17X26 10 PK STRL BLUE (TOWEL DISPOSABLE) ×4 IMPLANT
TOWEL OR NON WOVEN STRL DISP B (DISPOSABLE) ×2 IMPLANT
TRAP SPECIMEN MUCOUS 40CC (MISCELLANEOUS) ×1 IMPLANT
TRAY FOLEY W/METER SILVER 14FR (SET/KITS/TRAYS/PACK) ×2 IMPLANT
TRAY LAPAROSCOPIC (CUSTOM PROCEDURE TRAY) ×2 IMPLANT
TROCAR 12M 150ML BLUNT (TROCAR) ×2 IMPLANT
TROCAR BLADELESS OPT 5 100 (ENDOMECHANICALS) ×2 IMPLANT
TROCAR XCEL 12X100 BLDLESS (ENDOMECHANICALS) ×2 IMPLANT
TUBING INSUFFLATION 10FT LAP (TUBING) ×2 IMPLANT
WATER STERILE IRR 1500ML POUR (IV SOLUTION) ×4 IMPLANT

## 2014-07-31 NOTE — H&P (Signed)
Consult was requested by Dr. Christin Bach for the evaluation of Annette Herrera 49 y.o. female with pelvic pain, a 12cm left ovarian mass, elevated CA 125.  CC:  Chief Complaint  Patient presents with  . Ovarian Cyst    left    Assessment/Plan:  [name] is a 49 y.o. 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  I am recommending a robotic assisted total hysterectomy and left salpingo-oophorectomy with right salpingectomy. I do not recommend removal of a normal-appearing right ovary if benign pathology is confirmed in the left ovarian mass as the patient is premenopausal and early surgical menopause in a benign setting has been associated with lower life expectancy. I discussed with the patient that I have a low suspicion for malignancy given her young age, the symptoms of this mass being most consistent with a cyclic been on and such as endometriosis and endometrioma, and the relatively low elevation in CA-125. We will send the ovary for frozen section intraoperatively and if malignancy is identified would perform a comprehensive staging procedure or debulking as indicated.  I had an extensive discussion with Jenille regarding surgical risk including bleeding, infection, damage to internal organs (such as bladder,ureters, bowels), blood clot, reoperation and rehospitalization. I discussed that she was at an increased risk for these complications given her morbid obesity and she'll be an increased risk if she has intraperitoneal adhesions secondary to her underlying process of endometriosis. I discussed any efforts that she can make towards weight loss preoperatively will improve outcomes for her. I discussed anticipation's postoperatively including hospital stay (1 day) and anticipated recovery (4 weeks until return to work close (and postoperative restrictions on activity. I discussed  expectations regarding postoperative analgesia and pain relief. A discussed with the patient that I would be happy to prescribe analgesia for 4 weeks postoperatively but after that time, if there is no evidence for a postoperative complication, I will not be prescribing additional narcotic analgesia. I discussed that some patients with chronic pelvic pain do not have complete resolution of symptoms postoperatively, and if this is the case for Annette Herrera we will assist in referring her to her chronic pain clinic. If we leave the right ovary I discussed that there is a one in 10 risk for her requiring a subsequent surgery for an ovarian lesion.    HPI: Annette Herrera is a 49 year old gravida 2 para 2 who is seen in consultation at the request of Dr. Emelda Fear for a 12.5 x 9.0 x 8.0 cm left ovarian complex cyst. The patient has a 2-1/2 year history of cyclic severe pelvic pain. The pain is in the central pelvis and towards the right. She experiences it for approximately 46 days post cycle and is associated with abdominal bloating. To relieve the pain she uses heat, Midol (of low effect) and obtains low-dose narcotics "from the street". She reports that she tries to avoid narcotics wherever possible. She has not had health insurance for several years until recently and therefore was not able to see consultation regarding the pain. He saw her Dr. Emelda Fear in February and March the workup of this pain. A transvaginal ultrasound performed on 06/13/2014 and revealed a uterus measuring 9.3 x 6.7 x 4.7 cm, an endometrial stripe that was 11.8 mm, a normal-appearing right ovary, and the left ovary measuring 12.5 x 9.0 x 8.0 cm with a large complex cystic mass with multiple septations noted within. A  CA-125 was drawn on 06/13/2014 and was elevated at 71.7 units per milliliter.  Melvia had an endometrial ablation several years ago with a tubal ligation performed at the same time. She reports the onset of these symptoms since  that time. She continues to have cyclic spotting post-ablation however no normal menstrual flow. The pain in her pelvis has been becoming progressively worse with each cycle since her endometrial ablation. She is no other history of abdominal surgery.  Current Meds:  Outpatient Encounter Prescriptions as of 06/23/2014  Medication Sig  . oxyCODONE-acetaminophen (PERCOCET) 5-325 MG per tablet Take 2 tablets by mouth every 4 (four) hours as needed for severe pain.    Allergy: No Known Allergies  Social Hx:  History   Social History  . Marital Status: Single    Spouse Name: N/A  . Number of Children: N/A  . Years of Education: N/A   Occupational History  . Not on file.   Social History Main Topics  . Smoking status: Former Games developer  . Smokeless tobacco: Never Used  . Alcohol Use: No  . Drug Use: No  . Sexual Activity: Not Currently    Birth Control/ Protection: Surgical   Other Topics Concern  . Not on file   Social History Narrative    Past Surgical Hx:  Past Surgical History  Procedure Laterality Date  . Tonsillectomy    . Ablation    . Tubal ligation    . Ankle surgery    . Inner ear surgery    . Left index finger      Past Medical Hx:  Past Medical History  Diagnosis Date  . Depression   . Anxiety   . Morbid obesity with BMI of 45.0-49.9, adult     Past Gynecological History: G2P2 (SVD's). No hx of abnormal paps requiring intervention. S/p tubal. S/p endometrial ablation. Patient's last menstrual period was 05/30/2014.  Family Hx:  Family History  Problem Relation Age of Onset  . Diabetes Mother   . Diabetes Maternal Grandmother     Review of Systems:  Constitutional  Feels well,  ENT Normal appearing ears and nares bilaterally Skin/Breast  No rash, sores, jaundice, itching, dryness Cardiovascular  No chest pain, shortness of  breath, or edema  Pulmonary  No cough or wheeze.  Gastro Intestinal  No nausea, vomitting, or diarrhoea. No bright red blood per rectum, no abdominal pain, change in bowel movement, or constipation.  Genito Urinary  No frequency, urgency, dysuria,  Musculo Skeletal  No myalgia, arthralgia, joint swelling or pain  Neurologic  No weakness, numbness, change in gait,  Psychology  No depression, anxiety, insomnia.   Vitals: Blood pressure 120/96, pulse 70, temperature 98.3 F (36.8 C), temperature source Oral, resp. rate 18, height  (1.651 m), weight 290 lb 8 oz (131.77 kg), last menstrual period 05/30/2014.  Physical Exam: WD in NAD Neck  Supple NROM, without any enlargements.  Lymph Node Survey No cervical supraclavicular or inguinal adenopathy Cardiovascular  Pulse normal rate, regularity and rhythm. S1 and S2 normal.  Lungs  Clear to auscultation bilateraly, without wheezes/crackles/rhonchi. Good air movement.  Skin  No rash/lesions/breakdown  Psychiatry  Alert and oriented to person, place, and time  Abdomen  Normoactive bowel sounds, abdomen soft, non-tender and obese without evidence of hernia.  Back No CVA tenderness Genito Urinary  Vulva/vagina: Normal external female genitalia. No lesions. No discharge or bleeding. Bladder/urethra: No lesions or masses, well supported bladder Vagina: normal in appearance Cervix: Normal appearing, no  lesions. Uterus: Small, mobile, no parametrial involvement or nodularity. Adnexa: unable to appreciate mass likely secondary to body habitus. The pelvic organs are smooth and mobile. No fixed lesions. Rectal  Good tone, no masses no cul de sac nodularity.  Extremities  No bilateral cyanosis, clubbing or edema.   Quinn Axeossi, Emilia Kayes Caroline, MD

## 2014-07-31 NOTE — Anesthesia Postprocedure Evaluation (Signed)
  Anesthesia Post-op Note  Patient: Annette AgeeBelinda H Herrera  Procedure(s) Performed: Procedure(s) (LRB):  ROBOTIC ASSISTED TOTAL LAPAROSCOPIC HYSTERECTOMY WITH LEFT SALPINGO OOPHORECTOMY LEFT URETEROLYSIS RIGHT SALPINGECTOMY  (Left)  Patient Location: PACU  Anesthesia Type: General  Level of Consciousness: awake and alert   Airway and Oxygen Therapy: Patient Spontanous Breathing  Post-op Pain: mild  Post-op Assessment: Post-op Vital signs reviewed, Patient's Cardiovascular Status Stable, Respiratory Function Stable, Patent Airway and No signs of Nausea or vomiting  Last Vitals:  Filed Vitals:   07/31/14 1300  BP: 151/76  Pulse: 78  Temp: 36.6 C  Resp: 16    Post-op Vital Signs: stable   Complications: No apparent anesthesia complications

## 2014-07-31 NOTE — Anesthesia Preprocedure Evaluation (Addendum)
Anesthesia Evaluation  Patient identified by MRN, date of birth, ID band Patient awake    Reviewed: Allergy & Precautions, NPO status , Patient's Chart, lab work & pertinent test results  History of Anesthesia Complications (+) PONV  Airway Mallampati: II  TM Distance: >3 FB Neck ROM: Full    Dental no notable dental hx.    Pulmonary neg pulmonary ROS, former smoker,  breath sounds clear to auscultation  Pulmonary exam normal       Cardiovascular negative cardio ROS  Rhythm:Regular Rate:Normal     Neuro/Psych negative neurological ROS  negative psych ROS   GI/Hepatic negative GI ROS, Neg liver ROS,   Endo/Other  negative endocrine ROSMorbid obesity  Renal/GU negative Renal ROS  negative genitourinary   Musculoskeletal  (+) Fibromyalgia -  Abdominal   Peds negative pediatric ROS (+)  Hematology   Anesthesia Other Findings   Reproductive/Obstetrics negative OB ROS                            Anesthesia Physical Anesthesia Plan  ASA: II  Anesthesia Plan: General   Post-op Pain Management:    Induction: Intravenous  Airway Management Planned: Oral ETT  Additional Equipment:   Intra-op Plan:   Post-operative Plan: Extubation in OR  Informed Consent: I have reviewed the patients History and Physical, chart, labs and discussed the procedure including the risks, benefits and alternatives for the proposed anesthesia with the patient or authorized representative who has indicated his/her understanding and acceptance.   Dental advisory given  Plan Discussed with: CRNA  Anesthesia Plan Comments:         Anesthesia Quick Evaluation

## 2014-07-31 NOTE — Anesthesia Procedure Notes (Signed)
Procedure Name: Intubation Date/Time: 07/31/2014 7:49 AM Performed by: Paulla DollyJOYCE, Gianni Fuchs A Pre-anesthesia Checklist: Patient identified, Timeout performed, Emergency Drugs available, Suction available and Patient being monitored Patient Re-evaluated:Patient Re-evaluated prior to inductionOxygen Delivery Method: Circle system utilized Preoxygenation: Pre-oxygenation with 100% oxygen Intubation Type: Combination inhalational/ intravenous induction Ventilation: Mask ventilation without difficulty Laryngoscope Size: Mac and 4 Grade View: Grade II Tube type: Oral Tube size: 7.0 mm Number of attempts: 1 Airway Equipment and Method: Stylet Placement Confirmation: ETT inserted through vocal cords under direct vision,  positive ETCO2 and breath sounds checked- equal and bilateral Secured at: 20 cm Tube secured with: Tape Dental Injury: Teeth and Oropharynx as per pre-operative assessment

## 2014-07-31 NOTE — Op Note (Signed)
OPERATIVE NOTE  Surgeon: Quinn Axeossi, Betzy Barbier Caroline   Assistants: Antionette CharLisa Jackson-Moore, MD (an MD assistant was necessary for tissue manipulation, management of robotic instrumentation, retraction and positioning due to the complexity of the case and hospital policies).   Anesthesia: General endotracheal anesthesia  ASA Class: 3   Pre-operative Diagnosis: adnexal mass and pelvic pain  Post-operative Diagnosis: stage III endometriosis, left ovarian endometrioma  Operation: Robotic-assisted laparoscopic hysterectomy with bilateral salpingoophorectomy (an MD assistant was necessary for tissue manipulation, management of robotic instrumentation, retraction and positioning due to the complexity of the case and hospital policies).   Surgeon: Quinn Axeossi, Merrisa Skorupski Caroline  Assistant Surgeon: Antionette CharLisa Jackson Moore MD  Anesthesia: GET  Urine Output: 300cc  Operative Findings:  : frozen pelvis, morbid obesity and bulky retroperitoneal fat and intestinal bulk, right ovarian corpus luteum, uterus 10 week size, adherent to the anterior abdominal wall. Dense adhesions between sigmoid colon and left ovary and uterine fundus. 10cm left ovarian cyst in the cul de sac and adherent to the left side wall and ureter. 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 Frozen section of left ovary revealed benign endometrioma. Estimated Blood Loss:  200 mL      Total IV Fluids: 1,000 ml         Specimens: uterus, left tube and ovary, right fallopian tube, washings         Complications:  None; patient tolerated the procedure well.         Disposition: PACU - hemodynamically stable.  Procedure Details  The patient was seen in the Holding Room. The risks, benefits, complications, treatment options, and expected outcomes were discussed with the patient.  The patient concurred with the proposed plan, giving informed consent.  The site of surgery properly noted/marked. The  patient was identified as Annette Herrera and the procedure verified as a Robotic-assisted hysterectomy with bilateral salpingo oophorectomy. A Time Out was held and the above information confirmed.  After induction of anesthesia, the patient was draped and prepped in the usual sterile manner. Pt was placed in supine position after anesthesia and draped and prepped in the usual sterile manner. The abdominal drape was placed after the CholoraPrep had been allowed to dry for 3 minutes.  Her arms were tucked to her side with all appropriate precautions.  The chest was secured to the table.  The patient was placed in the semi-lithotomy position in PontoosucAllen stirrups.  The perineum was prepped with Betadine.  Foley catheter was placed.  A sterile speculum was placed in the vagina.  The cervix was grasped with a single-tooth tenaculum and dilated with Shawnie PonsPratt dilators.  The ZUMI uterine manipulator with a medium colpotomizer ring was placed without difficulty.  A pneum occluder balloon was placed over the manipulator.  A second time-out was performed.  OG tube placement was confirmed and to suction.    Procedure:  The patient was brought to the operating room where general anesthesia was administered with no complications.  The patient was placed in the dorsal lithotomy position in padded Allen stirrups.  The arms were tucked at the sides with gel pads protecting the elbows and foam protecting the hands. The patient was then prepped.  A Foley was placed to gravity.  A medium size KOH ring was used to place around the cervix after the cervix had been dilated and then a RUMI manipulator was attached in the normal manner.  The patient was then draped in the normal manner.  Next,  a 5 mm skin incision was made 1 cm below the subcostal margin in the midclavicular line.  The 5 mm Optiview port and scope was used for direct entry.  Opening pressure was under 10 mm CO2.  The abdomen was insufflated and the findings were noted as  above.   At this point and all points during the procedure, the patient's intra-abdominal pressure did not exceed 15 mmHg. Next, a 10 mm skin incision was made at the superior margin of the umbilicus and a right and left port was placed about 10 cm lateral to the robot port on the right and left side. All ports were placed under direct visualization.  The patient was placed in steep Trendelenburg.  Bowel was away into the upper abdomen.  The robot was docked in the normal manner.   For 30 minutes sharp adhesiolysis with the robotic scissors was performed to separate the sigmoid colon from its dense attachments to the left ovary and uterus. The cyst was unavoidably entered during this process and ruptured. However washings had been already sampled. The uterine adhesions to the anterior abdominal wall were also taken down with sharp dissection.   The hysterectomy was started after the round ligament on the right side was incised and the retroperitoneum was entered and the pararectal space was developed.  The ureter was noted to be on the medial leaf of the broad ligament.  The peritoneum above the ureter was incised and stretched and the utero-ovarian ligament was skeletonized, cauterized and cut.  The posterior peritoneum was taken down to the level of the KOH ring.  The anterior peritoneum was also taken down with sharp dissection to reveal the koh cup anteriorally.  The bladder flap was created to the level of the KOH ring.  The uterine artery on the right side was skeletonized, cauterized and cut in the normal manner.  The right tube was separated from the right ovary using sharp, monopolar and bipolar dissection.   The left retroperitoneum was similarly opened by transecting the left round ligament. It the white line of Toldt adhesions with the sigmoid colon were taken down. The left ureter was identified adherent to the medial leaf of the broad ligament secondary to retroperitoneal fibrosis. Meticulous  sharp ureterolysis was performed from the level of the pelvic brim to uterine artery. This circumferentially mobilized the ureter from its retroperitoneal attachments including those to the left ovarian cyst. The ovarian vessels were sealed and transected. The posterior peritoneum was taken down to the level of the KOH ring.  The anterior peritoneum was also taken down with sharp dissection to reveal the koh cup anteriorally. The colpotomy was made and the uterus, cervix, left ovaries and tubes were amputated and delivered through the vagina.  Pedicles were inspected and excellent hemostasis was achieved.    The colpotomy at the vaginal cuff was closed with Vicryl on a CT1 needle in a running manner.  Irrigation was used and excellent hemostasis was achieved. The frozen section returned benign. At this point in the procedure was completed.  Robotic instruments were removed under direct visulaization.  The robot was undocked. The 10 mm ports were closed with Vicryl on a UR-5 needle and the fascia was closed with 0 Vicryl on a UR-5 needle.  The skin was closed with 4-0 Vicryl in a subcuticular manner.  Steri-Strips were applied and bandages were also applied.  Sponge, lap and needle counts correct x 2.  The patient was taken to the recovery room in  stable condition.  The vagina was swabbed with  minimal bleeding noted.   All instrument and needle counts were correct x  3.   The patient was transferred to the recovery room in stable condition.   Quinn Axe, MD

## 2014-07-31 NOTE — Transfer of Care (Signed)
Immediate Anesthesia Transfer of Care Note  Patient: Annette Herrera  Procedure(s) Performed: Procedure(s):  ROBOTIC ASSISTED TOTAL LAPAROSCOPIC HYSTERECTOMY WITH LEFT SALPINGO OOPHORECTOMY LEFT URETEROLYSIS RIGHT SALPINGECTOMY  (Left)  Patient Location: PACU  Anesthesia Type:General  Level of Consciousness: awake, sedated and patient cooperative  Airway & Oxygen Therapy: Patient Spontanous Breathing and Patient connected to face mask oxygen  Post-op Assessment: Report given to RN and Post -op Vital signs reviewed and stable  Post vital signs: Reviewed and stable  Last Vitals:  Filed Vitals:   07/31/14 0533  BP: 149/89  Pulse: 64  Temp: 36.6 C  Resp: 16    Complications: No apparent anesthesia complications

## 2014-08-01 ENCOUNTER — Encounter (HOSPITAL_COMMUNITY): Payer: Self-pay | Admitting: Gynecologic Oncology

## 2014-08-01 DIAGNOSIS — N801 Endometriosis of ovary: Secondary | ICD-10-CM | POA: Diagnosis not present

## 2014-08-01 LAB — CBC
HCT: 36.4 % (ref 36.0–46.0)
Hemoglobin: 12.3 g/dL (ref 12.0–15.0)
MCH: 29.6 pg (ref 26.0–34.0)
MCHC: 33.8 g/dL (ref 30.0–36.0)
MCV: 87.5 fL (ref 78.0–100.0)
PLATELETS: 324 10*3/uL (ref 150–400)
RBC: 4.16 MIL/uL (ref 3.87–5.11)
RDW: 12.9 % (ref 11.5–15.5)
WBC: 11.3 10*3/uL — ABNORMAL HIGH (ref 4.0–10.5)

## 2014-08-01 LAB — BASIC METABOLIC PANEL
ANION GAP: 6 (ref 5–15)
CHLORIDE: 106 mmol/L (ref 96–112)
CO2: 27 mmol/L (ref 19–32)
Calcium: 8.4 mg/dL (ref 8.4–10.5)
Creatinine, Ser: 0.7 mg/dL (ref 0.50–1.10)
GFR calc non Af Amer: >90 mL/min (ref >90)
Glucose, Bld: 124 mg/dL — ABNORMAL HIGH (ref 70–99)
Potassium: 4 mmol/L (ref 3.5–5.1)
SODIUM: 139 mmol/L (ref 135–145)

## 2014-08-01 MED ORDER — DOCUSATE SODIUM 100 MG PO CAPS: 100.0000 mg | ORAL_CAPSULE | Freq: Two times a day (BID) | ORAL | Status: DC

## 2014-08-01 MED ORDER — OXYCODONE-ACETAMINOPHEN 5-325 MG PO TABS: 1.0000 | ORAL_TABLET | ORAL | Status: DC | PRN

## 2014-08-01 MED ORDER — ALPRAZOLAM 0.5 MG PO TABS
0.5000 mg | ORAL_TABLET | Freq: Every day | ORAL | Status: DC
Start: 2014-08-01 — End: 2021-07-12

## 2014-08-01 NOTE — Progress Notes (Signed)
08/01/14 1245  Reviewed discharge instructions with patient. Patient verbalized understanding of discharge instructions.  Copy of discharge instructions and prescription given to patient.

## 2014-08-01 NOTE — Discharge Summary (Signed)
Physician Discharge Summary  Patient ID: Annette Herrera MRN: [id number] DOB/AGE: [birth year] 49 y.o.  Admit date: 07/31/2014 Discharge date: 08/01/2014  Admission Diagnoses: Left ovarian cyst  Discharge Diagnoses:  Principal Problem:   Left ovarian cyst Active Problems:   Chronic pelvic pain in female   Morbid obesity   Broad ligament endometriosis   Endometriosis of ovary   Discharged Condition: good  Hospital Course: Patient was admitted on 07/31/14 for surgery (Robotic hysterectomy, LSO, right salpingectomy) for an endometrioma and pelvic pain. Surgery was uncomplicated. She had some nausea on POD 0 but did better on POD1. Her pain was well controlled on POD1. Postoperative Hb was appropriate.  Consults: None  Significant Diagnostic Studies: labs:  CBC    Component Value Date/Time   WBC 11.3* 08/01/2014 0515   RBC 4.16 08/01/2014 0515   HGB 12.3 08/01/2014 0515   HCT 36.4 08/01/2014 0515   PLT 324 08/01/2014 0515   MCV 87.5 08/01/2014 0515   MCH 29.6 08/01/2014 0515   MCHC 33.8 08/01/2014 0515   RDW 12.9 08/01/2014 0515   LYMPHSABS 2.2 07/28/2014 1335   MONOABS 0.4 07/28/2014 1335   EOSABS 0.1 07/28/2014 1335   BASOSABS 0.0 07/28/2014 1335    Treatments: surgery: see above  Discharge Exam: Blood pressure 158/77, pulse 55, temperature 98.2 F (36.8 C), temperature source Oral, resp. rate 18, height  (1.651 m), weight 277 lb (125.646 kg), last menstrual period 07/17/2014, SpO2 100 %. General appearance: alert and cooperative Resp: clear to auscultation bilaterally Cardio: regular rate and rhythm, S1, S2 normal, no murmur, click, rub or gallop GI: soft, non-tender; bowel sounds normal; no masses,  no organomegaly Extremities: extremities normal, atraumatic, no cyanosis or edema Incision/Wound: in tact, no drainage.  Disposition: 01-Home or Self Care  Discharge Instructions    (HEART FAILURE PATIENTS) Call MD:  Anytime you have any of the following symptoms:  1) 3 pound weight gain in 24 hours or 5 pounds in 1 week 2) shortness of breath, with or without a dry hacking cough 3) swelling in the hands, feet or stomach 4) if you have to sleep on extra pillows at night in order to breathe.    Complete by:  As directed      Call MD for:  difficulty breathing, headache or visual disturbances    Complete by:  As directed      Call MD for:  extreme fatigue    Complete by:  As directed      Call MD for:  hives    Complete by:  As directed      Call MD for:  persistant dizziness or light-headedness    Complete by:  As directed      Call MD for:  persistant nausea and vomiting    Complete by:  As directed      Call MD for:  redness, tenderness, or signs of infection (pain, swelling, redness, odor or green/yellow discharge around incision site)    Complete by:  As directed      Call MD for:  severe uncontrolled pain    Complete by:  As directed      Call MD for:  temperature >100.4    Complete by:  As directed      Diet - low sodium heart healthy    Complete by:  As directed      Diet general    Complete by:  As directed      Driving Restrictions  Complete by:  As directed   No driving for 7 days or until off narcotic pain medication     Increase activity slowly    Complete by:  As directed      Remove dressing in 24 hours    Complete by:  As directed      Sexual Activity Restrictions    Complete by:  As directed   No intercourse for 6 weeks            Medication List    TAKE these medications        alprazolam 2 MG tablet  Commonly known as:  XANAX  Take 2 mg by mouth at bedtime as needed for sleep. 1/4 @@ 8370m and then takes one tablet at 1.5 mg at 1 am.     ALPRAZolam 0.5 MG tablet  Commonly known as:  XANAX  Take 1 tablet (0.5 mg total) by mouth at bedtime.     docusate sodium 100 MG capsule  Commonly known as:  COLACE  Take 1 capsule (100 mg total) by mouth 2 (two) times daily.     oxyCODONE-acetaminophen 5-325 MG per tablet   Commonly known as:  PERCOCET  Take 2 tablets by mouth every 4 (four) hours as needed for severe pain.     oxyCODONE-acetaminophen 5-325 MG per tablet  Commonly known as:  PERCOCET/ROXICET  Take 1-2 tablets by mouth every 4 (four) hours as needed (moderate to severe pain (when tolerating fluids)).     phentermine 30 MG capsule  Take 1 capsule (30 mg total) by mouth every morning.         Signed: Quinn AxeRossi, Rianna Lukes Caroline 08/01/2014, 9:23 AM

## 2014-08-01 NOTE — Discharge Instructions (Signed)
08/01/2014  Return to work: 4 weeks  Activity: 1. Be up and out of the bed during the day.  Take a nap if needed.  You may walk up steps but be careful and use the hand rail.  Stair climbing will tire you more than you think, you may need to stop part way and rest.   2. No lifting or straining for 6 weeks.  3. No driving for 2 weeks.  Do Not drive if you are taking narcotic pain medicine.  4. Shower daily.  Use soap and water on your incision and pat dry; don't rub.   5. No sexual activity and nothing in the vagina for 8 weeks.  Diet: 1. Low sodium Heart Healthy Diet is recommended.  2. It is safe to use a laxative if you have difficulty moving your bowels.   Wound Care: 1. Keep clean and dry.  Shower daily.  Reasons to call the Doctor:   Fever - Oral temperature greater than 100.4 degrees Fahrenheit  Foul-smelling vaginal discharge  Difficulty urinating  Nausea and vomiting  Increased pain at the site of the incision that is unrelieved with pain medicine.  Difficulty breathing with or without chest pain  New calf pain especially if only on one side  Sudden, continuing increased vaginal bleeding with or without clots.   Follow-up: 1. See Adolphus BirchwoodEmma Lovenia Debruler in 4 weeks  Contacts: For questions or concerns you should contact:  Dr. Adolphus BirchwoodEmma Reuben Knoblock at [phone]

## 2014-08-09 ENCOUNTER — Other Ambulatory Visit: Payer: Self-pay | Admitting: Gynecologic Oncology

## 2014-08-11 NOTE — Telephone Encounter (Signed)
Attempt to reach pt regarding Pain med refill.  Mobile phone does not have voice mail set up, home# rings, no vm, unable to leave pt message. Pt will need to come in for Urine specimen, at that time rx for refill may be given to pt.

## 2014-08-12 ENCOUNTER — Telehealth: Payer: Self-pay | Admitting: *Deleted

## 2014-08-12 DIAGNOSIS — B3731 Acute candidiasis of vulva and vagina: Secondary | ICD-10-CM

## 2014-08-12 DIAGNOSIS — B373 Candidiasis of vulva and vagina: Secondary | ICD-10-CM

## 2014-08-12 MED ORDER — FLUCONAZOLE 100 MG PO TABS: 100.0000 mg | ORAL_TABLET | Freq: Once | ORAL | Status: DC

## 2014-08-12 NOTE — Telephone Encounter (Signed)
Called and spoke with patient in regards to refill request for Percocet. Patient states she is feeling better and no longer needs a refill on pain medication. She states she increased dulcolax to twice daily and her bowels are now moving and she is not expericing abdominal pressure like she was previously. Patient denies any symptoms of a UTI or any problems urinating. She does states she thinks she has a vaginal yeast infection. Per pt, "I am experiencing some vaginal itching and have some thick white discharge." Patient states she has taken diflucan previously for a yeast infection. Per Warner MccreedyMelissa Cross, NP prescription for 100mg  diflucan x 1 sent to patient's pharmacy. Patient agreeable to pickup medication. Told patient to please call our clinic back with any additional concerns or questions.

## 2014-08-26 ENCOUNTER — Telehealth: Payer: Self-pay | Admitting: Obstetrics and Gynecology

## 2014-08-26 NOTE — Telephone Encounter (Signed)
Pt states that she is 3 weeks post op. Pt wants to know when she could take a bath. Can she get in the pool or the hot tub?I spoke with Dr. Emelda FearFerguson about this and he advised that it should be fine for the pt to get in the pool or hot tub. I advised the pt of this and she verbalized understanding.

## 2014-09-09 ENCOUNTER — Encounter: Payer: Self-pay | Admitting: Obstetrics and Gynecology

## 2014-09-09 ENCOUNTER — Ambulatory Visit: Payer: 59 | Admitting: Obstetrics and Gynecology

## 2017-05-08 ENCOUNTER — Ambulatory Visit: Payer: Self-pay | Admitting: Family Medicine

## 2017-05-22 ENCOUNTER — Ambulatory Visit: Payer: Self-pay | Admitting: Family Medicine

## 2018-09-17 ENCOUNTER — Other Ambulatory Visit (HOSPITAL_COMMUNITY): Payer: Self-pay | Admitting: Obstetrics and Gynecology

## 2018-09-17 DIAGNOSIS — Z1231 Encounter for screening mammogram for malignant neoplasm of breast: Secondary | ICD-10-CM

## 2018-09-27 ENCOUNTER — Ambulatory Visit (HOSPITAL_COMMUNITY): Payer: Self-pay

## 2018-10-31 ENCOUNTER — Ambulatory Visit (HOSPITAL_COMMUNITY): Payer: Self-pay

## 2018-11-05 ENCOUNTER — Ambulatory Visit (HOSPITAL_COMMUNITY)
Admission: RE | Admit: 2018-11-05 | Discharge: 2018-11-05 | Disposition: A | Discharge status: Home / Self Care | Payer: Self-pay | Source: Ambulatory Visit | Attending: Obstetrics and Gynecology | Admitting: Obstetrics and Gynecology

## 2018-11-05 ENCOUNTER — Other Ambulatory Visit: Payer: Self-pay

## 2018-11-05 DIAGNOSIS — Z1231 Encounter for screening mammogram for malignant neoplasm of breast: Secondary | ICD-10-CM | POA: Insufficient documentation

## 2019-12-24 ENCOUNTER — Other Ambulatory Visit (HOSPITAL_COMMUNITY): Payer: Self-pay | Admitting: Obstetrics and Gynecology

## 2019-12-24 DIAGNOSIS — Z1231 Encounter for screening mammogram for malignant neoplasm of breast: Secondary | ICD-10-CM

## 2019-12-30 ENCOUNTER — Inpatient Hospital Stay (HOSPITAL_COMMUNITY): Admission: RE | Admit: 2019-12-30 | Payer: Self-pay | Source: Ambulatory Visit

## 2020-01-27 ENCOUNTER — Other Ambulatory Visit (HOSPITAL_COMMUNITY): Payer: Self-pay | Admitting: Adult Health

## 2020-01-27 DIAGNOSIS — Z1231 Encounter for screening mammogram for malignant neoplasm of breast: Secondary | ICD-10-CM

## 2020-01-29 ENCOUNTER — Other Ambulatory Visit: Payer: Self-pay

## 2020-01-29 ENCOUNTER — Ambulatory Visit (HOSPITAL_COMMUNITY)
Admission: RE | Admit: 2020-01-29 | Discharge: 2020-01-29 | Disposition: A | Discharge status: Home / Self Care | Payer: Self-pay | Source: Ambulatory Visit | Attending: Adult Health | Admitting: Adult Health

## 2020-01-29 DIAGNOSIS — Z1231 Encounter for screening mammogram for malignant neoplasm of breast: Secondary | ICD-10-CM | POA: Insufficient documentation

## 2020-12-17 ENCOUNTER — Encounter (HOSPITAL_COMMUNITY): Payer: Self-pay

## 2020-12-18 ENCOUNTER — Inpatient Hospital Stay (HOSPITAL_COMMUNITY): Admission: RE | Admit: 2020-12-18 | Payer: Self-pay | Source: Ambulatory Visit

## 2020-12-18 DIAGNOSIS — Z1231 Encounter for screening mammogram for malignant neoplasm of breast: Secondary | ICD-10-CM

## 2021-01-04 ENCOUNTER — Inpatient Hospital Stay (HOSPITAL_COMMUNITY): Admission: RE | Admit: 2021-01-04 | Payer: Self-pay | Source: Ambulatory Visit

## 2021-01-04 DIAGNOSIS — Z1231 Encounter for screening mammogram for malignant neoplasm of breast: Secondary | ICD-10-CM

## 2021-01-28 ENCOUNTER — Other Ambulatory Visit (HOSPITAL_COMMUNITY): Payer: Self-pay | Admitting: Adult Health

## 2021-01-28 DIAGNOSIS — Z1231 Encounter for screening mammogram for malignant neoplasm of breast: Secondary | ICD-10-CM

## 2021-02-01 ENCOUNTER — Other Ambulatory Visit: Payer: Self-pay

## 2021-02-01 ENCOUNTER — Ambulatory Visit (HOSPITAL_COMMUNITY)
Admission: RE | Admit: 2021-02-01 | Discharge: 2021-02-01 | Disposition: A | Discharge status: Home / Self Care | Payer: Self-pay | Source: Ambulatory Visit | Attending: Adult Health | Admitting: Adult Health

## 2021-02-01 DIAGNOSIS — Z1231 Encounter for screening mammogram for malignant neoplasm of breast: Secondary | ICD-10-CM | POA: Insufficient documentation

## 2021-03-01 DIAGNOSIS — K279 Peptic ulcer, site unspecified, unspecified as acute or chronic, without hemorrhage or perforation: Secondary | ICD-10-CM | POA: Insufficient documentation

## 2021-07-12 ENCOUNTER — Ambulatory Visit (INDEPENDENT_AMBULATORY_CARE_PROVIDER_SITE_OTHER): Payer: 59 | Admitting: Registered Nurse

## 2021-07-12 ENCOUNTER — Other Ambulatory Visit: Payer: Self-pay

## 2021-07-12 ENCOUNTER — Encounter: Payer: Self-pay | Admitting: Registered Nurse

## 2021-07-12 VITALS — BP 100/78 | HR 82 | Temp 98.0°F | Resp 18 | Ht 65.0 in | Wt 282.2 lb

## 2021-07-12 DIAGNOSIS — M722 Plantar fascial fibromatosis: Secondary | ICD-10-CM

## 2021-07-12 DIAGNOSIS — Z6841 Body Mass Index (BMI) 40.0 and over, adult: Secondary | ICD-10-CM

## 2021-07-12 DIAGNOSIS — G894 Chronic pain syndrome: Secondary | ICD-10-CM | POA: Diagnosis not present

## 2021-07-12 DIAGNOSIS — Z1211 Encounter for screening for malignant neoplasm of colon: Secondary | ICD-10-CM | POA: Diagnosis not present

## 2021-07-12 DIAGNOSIS — N6312 Unspecified lump in the right breast, upper inner quadrant: Secondary | ICD-10-CM

## 2021-07-12 DIAGNOSIS — Z8719 Personal history of other diseases of the digestive system: Secondary | ICD-10-CM | POA: Diagnosis not present

## 2021-07-12 DIAGNOSIS — H8113 Benign paroxysmal vertigo, bilateral: Secondary | ICD-10-CM

## 2021-07-12 DIAGNOSIS — Z1159 Encounter for screening for other viral diseases: Secondary | ICD-10-CM | POA: Diagnosis not present

## 2021-07-12 MED ORDER — ESOMEPRAZOLE MAGNESIUM 40 MG PO CPDR: 40.0000 mg | DELAYED_RELEASE_CAPSULE | Freq: Every day | ORAL | 3 refills | Status: DC

## 2021-07-12 MED ORDER — MECLIZINE HCL 25 MG PO TABS: 25.0000 mg | ORAL_TABLET | Freq: Two times a day (BID) | ORAL | 0 refills | Status: DC | PRN

## 2021-07-12 MED ORDER — PREGABALIN 75 MG PO CAPS: 75.0000 mg | ORAL_CAPSULE | Freq: Two times a day (BID) | ORAL | 0 refills | Status: DC

## 2021-07-12 NOTE — Progress Notes (Signed)
? ?New Patient Office Visit ? ?Subjective:  ?Patient ID: Annette AgeeBelinda H Derks, female    DOB: 01/16/1966  Age: 56 y.o. MRN: [id number] ? ?CC:  ?Chief Complaint  ?Patient presents with  ? New Patient (Initial Visit)  ? ? ?HPI ?Annette Herrera presents to establish care ? ?Vertigo ?Ongoing for some time.  ?Has used modified Epley maneuver  ? ?Back pain ?Feels secondary to plantar fasciitis. ?Limping led to throwing out her back with acute pain onset 2 weeks ago. ?No saddle symptoms or sciatic pain. ? ?Plantar fasciitis ?L foot. Ongoing. Worse lately than previous. ?Using a supportive device but minimal relief.  ? ?Knee pain ?Since hospitalization in November 2022.  ?She has had hx of patellar fracture in remote past ?No other acute trauma or injury.  ? ?Fibromyalgia ?Has been on duloxetine in the past - had spasms ?Stopped taking suddenly, led to severe anxiety / mental episode.  ?This was back in 2011-12. ? ?Breast lump ?R breast, medial, upper. 5 inches from nipple ?Well circumscribed.  ?No axillary adenopathy. ? ? ?Outpatient Encounter Medications as of 07/12/2021  ?Medication Sig  ? esomeprazole (NEXIUM) 40 MG capsule Take 1 capsule (40 mg total) by mouth daily.  ? meclizine (ANTIVERT) 25 MG tablet Take 1 tablet (25 mg total) by mouth 2 (two) times daily as needed for dizziness.  ? pregabalin (LYRICA) 75 MG capsule Take 1 capsule (75 mg total) by mouth 2 (two) times daily.  ? ALPRAZolam (XANAX) 0.5 MG tablet Take 1 tablet (0.5 mg total) by mouth at bedtime.  ? alprazolam (XANAX) 2 MG tablet Take 2 mg by mouth at bedtime as needed for sleep. 1/4 @@ 9297m and then takes one tablet at 1.5 mg at 1 am.  ? docusate sodium (COLACE) 100 MG capsule Take 1 capsule (100 mg total) by mouth 2 (two) times daily.  ? fluconazole (DIFLUCAN) 100 MG tablet Take 1 tablet (100 mg total) by mouth once.  ? oxyCODONE-acetaminophen (PERCOCET) 5-325 MG per tablet Take 2 tablets by mouth every 4 (four) hours as needed for severe pain. (Patient taking  differently: Take 1-2 tablets by mouth every 4 (four) hours as needed for severe pain (4-5 days out of the month. (cyclic pain)). )  ? oxyCODONE-acetaminophen (PERCOCET/ROXICET) 5-325 MG per tablet Take 1-2 tablets by mouth every 4 (four) hours as needed (moderate to severe pain (when tolerating fluids)).  ? phentermine 30 MG capsule Take 1 capsule (30 mg total) by mouth every morning.  ? ?No facility-administered encounter medications on file as of 07/12/2021.  ? ? ?Past Medical History:  ?Diagnosis Date  ? ADD (attention deficit disorder)   ? Allergy 2015  ? Anemia november 2022  ? Anxiety   ? Arthritis lower back and knees  ? 1992  ? Cataract 1999  ? have not had surgery for them  ? Clotting disorder (HCC) 2022  ? [facility]  ? Depression   ? no psychologist x several years  ? Fibromyalgia   ? GERD (gastroesophageal reflux disease)   ? Headache   ? IBS (irritable bowel syndrome)   ? Insomnia   ? Morbid obesity with BMI of 45.0-49.9, adult (HCC)   ? PONV (postoperative nausea and vomiting)   ? Sleep apnea not diagnosed  ? I am not sure but i think i have this  ? Ulcer 02/2021  ? emergency hospitalization [facility] vomiting blood. large ulcer  ? Vertigo   ? s/p radical mastoidectomy  L side.  ? ? ?Past Surgical History:  ?Procedure Laterality Date  ? ABDOMINAL HYSTERECTOMY  2016  ? large overian tumor/endometriosis  ? ABLATION    ? ANKLE SURGERY    ? COLONOSCOPY    ? FRACTURE SURGERY  2006  ? ankle/ index finger  ? INNER EAR SURGERY    ? left index finger    ? ROBOTIC ASSISTED LAP VAGINAL HYSTERECTOMY Left 07/31/2014  ? Procedure:  ROBOTIC ASSISTED TOTAL LAPAROSCOPIC HYSTERECTOMY WITH LEFT SALPINGO OOPHORECTOMY LEFT URETEROLYSIS RIGHT SALPINGECTOMY ;  Surgeon: Adolphus Birchwood, MD;  Location: [facility];  Service: Gynecology;  Laterality: Left;  ? TONSILLECTOMY    ? TUBAL LIGATION    ? ? ?Family History  ?Problem Relation Age of Onset  ? Diabetes Mother   ? Anxiety disorder Mother   ?  Arthritis Mother   ? COPD Mother   ? Depression Mother   ? Hypertension Mother   ? Diabetes Maternal Grandmother   ? Arthritis Maternal Grandmother   ? Depression Maternal Grandmother   ? Varicose Veins Maternal Grandmother   ? Depression Father   ? Drug abuse Father   ? Hearing loss Father   ? Heart disease Father   ? Hypertension Father   ? Alcohol abuse Paternal Grandfather   ? Heart disease Paternal Grandmother   ? Obesity Paternal Grandmother   ? ADD / ADHD Maternal Uncle   ? ADD / ADHD Daughter   ? Depression Daughter   ? Asthma Maternal Aunt   ? Intellectual disability Maternal Aunt   ? Learning disabilities Maternal Aunt   ? Varicose Veins Maternal Aunt   ? Heart disease Maternal Aunt   ? ? ?Social History  ? ?Socioeconomic History  ? Marital status: Divorced  ?  Spouse name: Not on file  ? Number of children: Not on file  ? Years of education: Not on file  ? Highest education level: Not on file  ?Occupational History  ? Not on file  ?Tobacco Use  ? Smoking status: Former  ?  Types: Cigarettes  ?  Quit date: 04/04/2013  ?  Years since quitting: 8.2  ? Smokeless tobacco: Never  ? Tobacco comments:  ?  I stoppped smoking April 2021  ?Substance and Sexual Activity  ? Alcohol use: No  ? Drug use: Yes  ?  Types: Marijuana  ?  Comment: marijuana 07/27/14  ? Sexual activity: Not Currently  ?  Birth control/protection: Abstinence, Surgical  ?  Comment: Abstinent for 3 years  ?Other Topics Concern  ? Not on file  ?Social History Narrative  ? Not on file  ? ?Social Determinants of Health  ? ?Financial Resource Strain: Not on file  ?Food Insecurity: Not on file  ?Transportation Needs: Not on file  ?Physical Activity: Not on file  ?Stress: Not on file  ?Social Connections: Not on file  ?Intimate Partner Violence: Not on file  ? ? ?ROS ?Review of Systems  ?Constitutional: Negative.   ?HENT: Negative.    ?Eyes: Negative.   ?Respiratory: Negative.    ?Cardiovascular: Negative.   ?Gastrointestinal: Negative.    ?Genitourinary: Negative.   ?Musculoskeletal:  Positive for arthralgias and myalgias.  ?Skin: Negative.   ?Neurological: Negative.   ?Psychiatric/Behavioral: Negative.    ?All other systems reviewed and are negative. ? ?Objective:  ? ?Today's Vitals: BP 100/78   Pulse 82   Temp 98 ?F (36.7 ?C) (Temporal)   Resp 18   Ht 5\' 5"  (1.651 m)   Wt  282 lb 3.2 oz (128 kg)   LMP 07/28/2014 (Exact Date)   SpO2 98%   BMI 46.96 kg/m?  ? ?Physical Exam ?Constitutional:   ?   General: She is not in acute distress. ?   Appearance: Normal appearance. She is normal weight. She is not ill-appearing, toxic-appearing or diaphoretic.  ?HENT:  ?   Head: Normocephalic and atraumatic.  ?Skin: ?   Coloration: Skin is not jaundiced or pale.  ?   Findings: No bruising, erythema, lesion or rash.  ?Neurological:  ?   Mental Status: She is alert.  ?Psychiatric:     ?   Mood and Affect: Mood normal.     ?   Behavior: Behavior normal.     ?   Thought Content: Thought content normal.     ?   Judgment: Judgment normal.  ? ? ? ? ? ? ?Assessment & Plan:  ? ?Problem List Items Addressed This Visit   ?None ?Visit Diagnoses   ? ? Mass of upper inner quadrant of right breast    -  Primary  ? Relevant Orders  ? MM DIAG BREAST TOMO BILATERAL  ? US BREAST COMPLETE UNI RIGHT INC AXILLA  ? Colon cancer screening      ? Relevant Orders  ? Ambulatory referral to Gastroenterology  ? Benign paroxysmal positional vertigo due to bilateral vestibular disorder      ? Relevant Medications  ? meclizine (ANTIVERT) 25 MG tablet  ? History of GI bleed      ? Relevant Medications  ? esomeprazole (NEXIUM) 40 MG capsule  ? Other Relevant Orders  ? Ambulatory referral to Gastroenterology  ? CBC with Differential/Platelet  ? Iron, TIBC and Ferritin Panel  ? Plantar fasciitis of left foot      ? Relevant Orders  ? Ambulatory referral to Podiatry  ? Chronic pain syndrome      ? Relevant Medications  ? pregabalin (LYRICA) 75 MG capsule  ? Other Relevant Orders  ? TSH  ? T4,  free  ? Morbid obesity with BMI of 45.0-49.9, adult (HCC)      ? Relevant Orders  ? Comprehensive metabolic panel  ? Hemoglobin A1c  ? Lipid panel  ? Encounter for screening for other viral diseases      ? Relevant Orders  ? Hepa

## 2021-07-12 NOTE — Patient Instructions (Addendum)
Annette Herrera -  ? ?Wonderful to meet you ? ?Call with any concerns ? ?Labs in 1-2 weeks ? ?Physical in 4-5 weeks. ? ?Thanks, ? ? ?Rich  ? ? ? ?If you have lab work done today you will be contacted with your lab results within the next 2 weeks.  If you have not heard from [place] then please contact us. The fastest way to get your results is to register for My Chart. ? ? ?IF you received an x-ray today, you will receive an invoice from [facility]. Please contact [facility] at [phone] with questions or concerns regarding your invoice.  ? ?IF you received labwork today, you will receive an invoice from [facility]. Please contact [facility] at [phone] with questions or concerns regarding your invoice.  ? ?Our billing staff will not be able to assist you with questions regarding bills from these companies. ? ?You will be contacted with the lab results as soon as they are available. The fastest way to get your results is to activate your My Chart account. Instructions are located on the last page of this paperwork. If you have not heard from [place] regarding the results in 2 weeks, please contact this office. ?  ? ? ?

## 2021-07-23 ENCOUNTER — Encounter: Payer: Self-pay | Admitting: Registered Nurse

## 2021-07-26 ENCOUNTER — Other Ambulatory Visit: Payer: 59

## 2021-07-27 ENCOUNTER — Ambulatory Visit (INDEPENDENT_AMBULATORY_CARE_PROVIDER_SITE_OTHER): Payer: 59

## 2021-07-27 ENCOUNTER — Other Ambulatory Visit (INDEPENDENT_AMBULATORY_CARE_PROVIDER_SITE_OTHER): Payer: 59

## 2021-07-27 ENCOUNTER — Ambulatory Visit: Payer: 59 | Admitting: Podiatrist

## 2021-07-27 ENCOUNTER — Encounter: Payer: Self-pay | Admitting: Podiatrist

## 2021-07-27 DIAGNOSIS — Z1159 Encounter for screening for other viral diseases: Secondary | ICD-10-CM

## 2021-07-27 DIAGNOSIS — M722 Plantar fascial fibromatosis: Secondary | ICD-10-CM

## 2021-07-27 DIAGNOSIS — Z6841 Body Mass Index (BMI) 40.0 and over, adult: Secondary | ICD-10-CM

## 2021-07-27 DIAGNOSIS — Z8719 Personal history of other diseases of the digestive system: Secondary | ICD-10-CM | POA: Diagnosis not present

## 2021-07-27 DIAGNOSIS — G894 Chronic pain syndrome: Secondary | ICD-10-CM | POA: Diagnosis not present

## 2021-07-27 LAB — CBC WITH DIFFERENTIAL/PLATELET
Basophils Absolute: 0 10*3/uL (ref 0.0–0.1)
Basophils Relative: 0.8 % (ref 0.0–3.0)
Eosinophils Absolute: 0.1 10*3/uL (ref 0.0–0.7)
Eosinophils Relative: 1.6 % (ref 0.0–5.0)
HCT: 39.6 % (ref 36.0–46.0)
Hemoglobin: 13.1 g/dL (ref 12.0–15.0)
Lymphocytes Relative: 32.5 % (ref 12.0–46.0)
Lymphs Abs: 1.9 10*3/uL (ref 0.7–4.0)
MCHC: 33 g/dL (ref 30.0–36.0)
MCV: 84.2 fl (ref 78.0–100.0)
Monocytes Absolute: 0.4 10*3/uL (ref 0.1–1.0)
Monocytes Relative: 7.4 % (ref 3.0–12.0)
Neutro Abs: 3.3 10*3/uL (ref 1.4–7.7)
Neutrophils Relative %: 57.7 % (ref 43.0–77.0)
Platelets: 275 10*3/uL (ref 150.0–400.0)
RBC: 4.7 Mil/uL (ref 3.87–5.11)
RDW: 16.3 % — ABNORMAL HIGH (ref 11.5–15.5)
WBC: 5.7 10*3/uL (ref 4.0–10.5)

## 2021-07-27 LAB — COMPREHENSIVE METABOLIC PANEL
ALT: 11 U/L (ref 0–35)
AST: 10 U/L (ref 0–37)
Albumin: 3.9 g/dL (ref 3.5–5.2)
Alkaline Phosphatase: 80 U/L (ref 39–117)
BUN: 7 mg/dL (ref 6–23)
CO2: 27 mEq/L (ref 19–32)
Calcium: 8.8 mg/dL (ref 8.4–10.5)
Chloride: 104 mEq/L (ref 96–112)
Creatinine, Ser: 0.59 mg/dL (ref 0.40–1.20)
GFR: 102.03 mL/min (ref >60.00)
Glucose, Bld: 112 mg/dL — ABNORMAL HIGH (ref 70–99)
Potassium: 4.5 mEq/L (ref 3.5–5.1)
Sodium: 138 mEq/L (ref 135–145)
Total Bilirubin: 0.4 mg/dL (ref 0.2–1.2)
Total Protein: 6.4 g/dL (ref 6.0–8.3)

## 2021-07-27 LAB — LIPID PANEL
Cholesterol: 158 mg/dL (ref 0–200)
HDL: 51 mg/dL (ref >39.00)
LDL Cholesterol: 87 mg/dL (ref 0–99)
NonHDL: 106.72
Total CHOL/HDL Ratio: 3
Triglycerides: 99 mg/dL (ref 0.0–149.0)
VLDL: 19.8 mg/dL (ref 0.0–40.0)

## 2021-07-27 LAB — HEMOGLOBIN A1C: Hgb A1c MFr Bld: 6.2 % (ref 4.6–6.5)

## 2021-07-27 LAB — TSH: TSH: 1.56 u[IU]/mL (ref 0.35–5.50)

## 2021-07-27 LAB — T4, FREE: Free T4: 0.75 ng/dL (ref 0.60–1.60)

## 2021-07-27 MED ORDER — TRIAMCINOLONE ACETONIDE 10 MG/ML IJ SUSP
10.0000 mg | Freq: Once | INTRAMUSCULAR | Status: AC
  Administered 2021-07-27: 10 mg

## 2021-07-27 NOTE — Progress Notes (Signed)
?Chief Complaint  ?Patient presents with  ? Plantar Fasciitis  ?  Plantar fasciitis of left foot Referring Provider: Janeece Agee  ?  ? ?HPI: Patient is 56 y.o. female who presents today for pain in the plantar aspect of bilateral heels.  She relates that her feet hurt all over but especially with the first up in the morning or after sitting for long periods of time.  She has tried different shoes but the pain continues to be present. ? ?Patient Active Problem List  ? Diagnosis Date Noted  ? Peptic ulcer 03/01/2021  ? Morbid obesity (HCC) 07/31/2014  ? 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 Endometriosis of ovary 07/31/2014  ? Left ovarian cyst 06/23/2014  ? Chronic pelvic pain in female 06/23/2014  ? Routine gynecological examination 06/25/2013  ? ? ?Current Outpatient Medications on File Prior to Visit  ?Medication Sig Dispense Refill  ? dexamethasone (DECADRON) 4 MG tablet Take 4 mg by mouth 3 (three) times daily.    ? esomeprazole (NEXIUM) 40 MG capsule Take 1 capsule (40 mg total) by mouth daily. 90 capsule 3  ? ibuprofen (ADVIL) 400 MG tablet Take 400 mg by mouth every 4 (four) hours as needed.    ? meclizine (ANTIVERT) 25 MG tablet Take 1 tablet (25 mg total) by mouth 2 (two) times daily as needed for dizziness. 90 tablet 0  ? pantoprazole (PROTONIX) 40 MG tablet Take 40 mg by mouth 2 (two) times daily.    ? pregabalin (LYRICA) 75 MG capsule Take 1 capsule (75 mg total) by mouth 2 (two) times daily. 90 capsule 0  ? ?No current facility-administered medications on file prior to visit.  ? ? ?Allergies  ?Allergen Reactions  ? Penicillins   ? ? ?Review of Systems ?No fevers, chills, nausea, muscle aches, no difficulty breathing, no calf pain, no chest pain or shortness of breath. ? ? ?Physical Exam ? ? GENERAL APPEARANCE: Alert, conversant. Appropriately groomed. No acute distress.  ? ?VASCULAR: Pedal pulses palpable and strong bilateral.  Capillary refill time is immediate to all digits,  Proximal to  distal cooling it warm to warm.  Digital hair growth is present bilateral  ? ?NEUROLOGIC: sensation is intact epicritically and protectively to 5.07 monofilament at 5/5 sites bilateral.  Light touch is intact bilateral, vibratory sensation intact bilateral, achilles tendon reflex is intact bilateral.  Negative Tinel sign is elicited ? ?MUSCULOSKELETAL: pes planus foot type noted. Pain on palpation plantar medial aspect both feet  at insertion of plantar fascia on the medial calcaneal tubercle. Inflammation at the insertion of the plantar fascia is present. Rectus foot type is seen. ? ?DERMATOLOGIC: skin color, texture, and turger are within normal limits.  No preulcerative lesions are seen, no interdigital maceration noted.  No open lesions present.  Digital nails are asymptomatic.  ?  ?Radiographic exam: ? ?Normal osseous mineralization.  No fracture or dislocation or acute osseous abnormalities present.  Joint spaces are normal. Large inferior spurs present bilateral heels.  Pes planus bilateral ? ? ?Assessment  ? ?  ICD-10-CM   ?1. Plantar fasciitis of left foot  M72.2 DG Foot Complete Left  ?  ?2. Plantar fasciitis of right foot  M72.2 DG Foot Complete Right  ?  ? ? ? ?Plan  ?An injection of bilateral plantar fascia is recommended and the patient agreed. Procedure note below attached.   ? ?Recommended shoe gear changes and insert recommendations as well as stretching exercises.  ?She will call if the injection fails  to relieve her pain.   ? ?Procedure: Injection Tendon/Ligament bilateral ?Discussed alternatives, risks, complications and verbal consent was obtained.  ?Location:RIGHT and LEFT plantar fascia at the glabrous junction; medial approach. ? ?Skin Prep: Alcohol  ?Injectate: 0.5cc 0.5% marcaine plain, 0.5 cc 2% lidocaine plain and, 1 cc kenalog 10. ?Disposition: Patient tolerated procedure well. Injection site dressed with a band-aid.  ?Post-injection care was discussed and return precautions discussed.   ?

## 2021-07-27 NOTE — Patient Instructions (Signed)

## 2021-07-28 ENCOUNTER — Other Ambulatory Visit: Payer: Self-pay | Admitting: Registered Nurse

## 2021-07-28 ENCOUNTER — Other Ambulatory Visit (HOSPITAL_COMMUNITY): Payer: Self-pay | Admitting: Registered Nurse

## 2021-07-28 DIAGNOSIS — N6310 Unspecified lump in the right breast, unspecified quadrant: Secondary | ICD-10-CM

## 2021-07-28 DIAGNOSIS — N6312 Unspecified lump in the right breast, upper inner quadrant: Secondary | ICD-10-CM

## 2021-07-28 LAB — IRON,TIBC AND FERRITIN PANEL
%SAT: 10 % (calc) — ABNORMAL LOW (ref 16–45)
Ferritin: 16 ng/mL (ref 16–232)
Iron: 35 ug/dL — ABNORMAL LOW (ref 45–160)
TIBC: 364 mcg/dL (calc) (ref 250–450)

## 2021-07-28 LAB — HEPATITIS C ANTIBODY
Hepatitis C Ab: NONREACTIVE
SIGNAL TO CUT-OFF: 0.15 (ref <1.00)

## 2021-07-30 ENCOUNTER — Other Ambulatory Visit: Payer: Self-pay | Admitting: Registered Nurse

## 2021-07-30 ENCOUNTER — Encounter: Payer: Self-pay | Admitting: Registered Nurse

## 2021-08-23 ENCOUNTER — Ambulatory Visit: Payer: 59 | Admitting: Registered Nurse

## 2021-08-24 ENCOUNTER — Encounter (HOSPITAL_COMMUNITY): Payer: 59

## 2021-08-24 ENCOUNTER — Ambulatory Visit (HOSPITAL_COMMUNITY): Admission: RE | Admit: 2021-08-24 | Payer: 59 | Source: Ambulatory Visit

## 2021-08-25 ENCOUNTER — Encounter: Payer: Self-pay | Admitting: Nurse Practitioner

## 2021-09-01 ENCOUNTER — Other Ambulatory Visit: Payer: Self-pay

## 2021-09-01 ENCOUNTER — Ambulatory Visit (INDEPENDENT_AMBULATORY_CARE_PROVIDER_SITE_OTHER): Payer: 59 | Admitting: Registered Nurse

## 2021-09-01 ENCOUNTER — Encounter: Payer: Self-pay | Admitting: Registered Nurse

## 2021-09-01 VITALS — BP 105/75 | HR 67 | Temp 98.3°F | Resp 17 | Ht 65.0 in | Wt 282.2 lb

## 2021-09-01 DIAGNOSIS — Z9889 Other specified postprocedural states: Secondary | ICD-10-CM

## 2021-09-01 DIAGNOSIS — Z0000 Encounter for general adult medical examination without abnormal findings: Secondary | ICD-10-CM

## 2021-09-01 DIAGNOSIS — H8113 Benign paroxysmal vertigo, bilateral: Secondary | ICD-10-CM

## 2021-09-01 NOTE — Patient Instructions (Addendum)
Ms. Stave -   Can't tell you how thrilled I am to hear that you're doing better.  Call if you need anything, otherwise, see you in 6 mo to recheck on levels.  Check out [employer] for some easygoing exercise.  Thanks,  Rich     If you have lab work done today you will be contacted with your lab results within the next 2 weeks.  If you have not heard from [place] then please contact us. The fastest way to get your results is to register for My Chart.   IF you received an x-ray today, you will receive an invoice from [facility]. Please contact [facility] at [phone] with questions or concerns regarding your invoice.   IF you received labwork today, you will receive an invoice from [place]. Please contact [facility] at [phone] with questions or concerns regarding your invoice.   Our billing staff will not be able to assist you with questions regarding bills from these companies.  You will be contacted with the lab results as soon as they are available. The fastest way to get your results is to activate your My Chart account. Instructions are located on the last page of this paperwork. If you have not heard from [place] regarding the results in 2 weeks, please contact this office.

## 2021-09-01 NOTE — Progress Notes (Signed)
Complete physical exam  Patient: Annette Herrera   DOB: [birth year]   56 y.o. Female  MRN: [id number] Visit Date: 09/01/2021  Subjective:    Chief Complaint  Patient presents with   Annual Exam    Patient states she is here for a CPE and Medication     Annette Herrera is a 56 y.o. female who presents today for a complete physical exam. She reports consuming a general diet.     She generally feels well. She reports sleeping well. She does have additional problems to discuss today.   Vision:Within the last year Dental:Within Last 6 months STD Screen:No PSA:No  Pain Much improved with lyrica.  No concerns, no AE.  Vertigo Persistent despite meclizine use Problem with side to side motion  She has ongoing issues with changes in position Left ear seems affected. Hx of radical mastoidectomy on that side. Notes she had been told to put drops in this but they have expired.   Most recent fall risk assessment:    09/01/2021    2:46 PM  Fall Risk   Falls in the past year? 0  Number falls in past yr: 0  Injury with Fall? 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   Endometriosis of ovary 07/31/2014   Left ovarian cyst 06/23/2014   Chronic pelvic pain in female 06/23/2014   Routine gynecological examination 06/25/2013   Past Medical History:  Diagnosis Date   ADD (attention deficit disorder)    Allergy 2015   Anemia november 2022   Anxiety    Arthritis lower back and knees   1992   Cataract 1999   have not had surgery for them   Clotting disorder (HCC) 2022   emergency bleeding ulcers in [facility] for 3/4 days   Depression    no psychologist x several years   Fibromyalgia    GERD (gastroesophageal  reflux disease)    Headache    IBS (irritable bowel syndrome)    Insomnia    Morbid obesity with BMI of 45.0-49.9, adult (HCC)    PONV (postoperative nausea and vomiting)    Sleep apnea not diagnosed   I am not sure but i think i have this   Ulcer 02/2021   emergency hospitalization [facility] vomiting blood. large ulcer   Vertigo    s/p radical mastoidectomy L side.   Past Surgical History:  Procedure Laterality Date   ABDOMINAL HYSTERECTOMY  2016   large overian tumor/endometriosis   ABLATION     ANKLE SURGERY     COLONOSCOPY     FRACTURE SURGERY  2006   ankle/ index finger   INNER EAR SURGERY     left index finger     ROBOTIC ASSISTED LAP VAGINAL HYSTERECTOMY Left 07/31/2014   Procedure:  ROBOTIC ASSISTED TOTAL LAPAROSCOPIC HYSTERECTOMY WITH LEFT SALPINGO OOPHORECTOMY LEFT URETEROLYSIS RIGHT SALPINGECTOMY ;  Surgeon: Adolphus Birchwood, MD;  Location: [facility];  Service: Gynecology;  Laterality: Left;   TONSILLECTOMY     TUBAL LIGATION     Social History   Tobacco Use   Smoking status: Former    Types: Cigarettes  Quit date: 04/04/2013    Years since quitting: 8.4   Smokeless tobacco: Never   Tobacco comments:    I stoppped smoking April 2021  Substance Use Topics   Alcohol use: No   Drug use: Yes    Types: Marijuana    Comment: marijuana 07/27/14   Social History   Socioeconomic History   Marital status: Divorced    Spouse name: Not on file   Number of children: Not on file   Years of education: Not on file   Highest education level: Not on file  Occupational History   Not on file  Tobacco Use   Smoking status: Former    Types: Cigarettes    Quit date: 04/04/2013    Years since quitting: 8.4   Smokeless tobacco: Never   Tobacco comments:    I stoppped smoking April 2021  Substance and Sexual Activity   Alcohol use: No   Drug use: Yes    Types: Marijuana    Comment: marijuana 07/27/14   Sexual activity: Not Currently    Birth control/protection: Abstinence,  Surgical    Comment: Abstinent for 3 years  Other Topics Concern   Not on file  Social History Narrative   Not on file   Social Determinants of Health   Financial Resource Strain: Not on file  Food Insecurity: Not on file  Transportation Needs: Not on file  Physical Activity: Not on file  Stress: Not on file  Social Connections: Not on file  Intimate Partner Violence: Not on file   Family Status  Relation Name Status   [facility] Alive   Daughter  Alive   Son  Alive   MGM faye clark Alive   MGF  Deceased   Father Gwendolyn LimaBenny Lynn Williams (Not Specified)   PGF douglass holley (Not Specified)   PGM l williams (Not Specified)   Mat Uncle dwayne clark (Not Specified)   Daughter miranda steed (Not Specified)   Mat Environmental managerAunt Glenda (Not Specified)   Mat Aunt Wendall PapaBrenda Holley (Not Specified)   Family History  Problem Relation Age of Onset   Diabetes Mother    Anxiety disorder Mother    Arthritis Mother    COPD Mother    Depression Mother    Hypertension Mother    Diabetes Maternal Grandmother    Arthritis Maternal Grandmother    Depression Maternal Grandmother    Varicose Veins Maternal Grandmother    Depression Father    Drug abuse Father    Hearing loss Father    Heart disease Father    Hypertension Father    Alcohol abuse Paternal Grandfather    Heart disease Paternal Grandmother    Obesity Paternal Grandmother    ADD / ADHD Maternal Uncle    ADD / ADHD Daughter    Depression Daughter    Asthma Maternal Aunt    Intellectual disability Maternal Aunt    Learning disabilities Maternal Aunt    Varicose Veins Maternal Aunt    Heart disease Maternal Aunt    Allergies  Allergen Reactions   Penicillins      Patient Care Team: Janeece AgeeMorrow, Jalien Weakland, NP as PCP - General (Adult Health Nurse Practitioner)   Medications: Outpatient Medications Prior to Visit  Medication Sig   esomeprazole (NEXIUM) 40 MG capsule Take 1 capsule (40 mg total) by mouth daily.   meclizine  (ANTIVERT) 25 MG tablet Take 1 tablet (25 mg total) by mouth 2 (two) times daily as needed for dizziness.  dexamethasone (DECADRON) 4 MG tablet Take 4 mg by mouth 3 (three) times daily.   ibuprofen (ADVIL) 400 MG tablet Take 400 mg by mouth every 4 (four) hours as needed.   pantoprazole (PROTONIX) 40 MG tablet Take 40 mg by mouth 2 (two) times daily.   No facility-administered medications prior to visit.    Review of Systems  Constitutional: Negative.   HENT: Negative.    Eyes: Negative.   Respiratory: Negative.    Cardiovascular: Negative.   Gastrointestinal: Negative.   Genitourinary: Negative.   Musculoskeletal: Negative.   Skin: Negative.   Neurological: Negative.   Psychiatric/Behavioral: Negative.    All other systems reviewed and are negative.  Last CBC Lab Results  Component Value Date   WBC 5.7 07/27/2021   HGB 13.1 07/27/2021   HCT 39.6 07/27/2021   MCV 84.2 07/27/2021   MCH 29.6 08/01/2014   RDW 16.3 (H) 07/27/2021   PLT 275.0 07/27/2021   Last metabolic panel Lab Results  Component Value Date   GLUCOSE 112 (H) 07/27/2021   NA 138 07/27/2021   K 4.5 07/27/2021   CL 104 07/27/2021   CO2 27 07/27/2021   BUN 7 07/27/2021   CREATININE 0.59 07/27/2021   GFRNONAA >90 08/01/2014   CALCIUM 8.8 07/27/2021   PROT 6.4 07/27/2021   ALBUMIN 3.9 07/27/2021   BILITOT 0.4 07/27/2021   ALKPHOS 80 07/27/2021   AST 10 07/27/2021   ALT 11 07/27/2021   ANIONGAP 6 08/01/2014   Last lipids Lab Results  Component Value Date   CHOL 158 07/27/2021   HDL 51.00 07/27/2021   LDLCALC 87 07/27/2021   TRIG 99.0 07/27/2021   CHOLHDL 3 07/27/2021   Last hemoglobin A1c Lab Results  Component Value Date   HGBA1C 6.2 07/27/2021   Last thyroid functions Lab Results  Component Value Date   TSH 1.56 07/27/2021   Last vitamin D No results found for: 25OHVITD2, 25OHVITD3, VD25OH Last vitamin B12 and Folate No results found for: VITAMINB12, FOLATE      Objective:      BP 105/75   Pulse 67   Temp 98.3 F (36.8 C) (Temporal)   Resp 17   Ht 5\' 5"  (1.651 m)   Wt 282 lb 3.2 oz (128 kg)   LMP 07/28/2014 (Exact Date)   SpO2 99%   BMI 46.96 kg/m   BP Readings from Last 3 Encounters:  09/01/21 105/75  07/12/21 100/78  08/01/14 140/68   Wt Readings from Last 3 Encounters:  09/01/21 282 lb 3.2 oz (128 kg)  07/12/21 282 lb 3.2 oz (128 kg)  07/31/14 277 lb (125.6 kg)   SpO2 Readings from Last 3 Encounters:  09/01/21 99%  07/12/21 98%  08/01/14 98%      Physical Exam Vitals and nursing note reviewed.  Constitutional:      General: She is not in acute distress.    Appearance: Normal appearance. She is obese. She is not ill-appearing, toxic-appearing or diaphoretic.  HENT:     Head: Normocephalic and atraumatic.     Right Ear: Tympanic membrane, ear canal and external ear normal. There is no impacted cerumen.     Left Ear: Tympanic membrane, ear canal and external ear normal. There is no impacted cerumen.     Nose: Nose normal. No congestion or rhinorrhea.     Mouth/Throat:     Mouth: Mucous membranes are moist.     Pharynx: Oropharynx is clear. No oropharyngeal exudate or posterior oropharyngeal erythema.  Eyes:  General: No scleral icterus.       Right eye: No discharge.        Left eye: No discharge.     Extraocular Movements: Extraocular movements intact.     Conjunctiva/sclera: Conjunctivae normal.     Pupils: Pupils are equal, round, and reactive to light.  Neck:     Vascular: No carotid bruit.  Cardiovascular:     Rate and Rhythm: Normal rate and regular rhythm.     Pulses: Normal pulses.     Heart sounds: Normal heart sounds. No murmur heard.   No friction rub. No gallop.  Pulmonary:     Effort: Pulmonary effort is normal. No respiratory distress.     Breath sounds: Normal breath sounds. No stridor. No wheezing, rhonchi or rales.  Chest:     Chest wall: No tenderness.  Abdominal:     General: Abdomen is flat. Bowel sounds  are normal. There is no distension.     Palpations: There is no mass.     Tenderness: There is no abdominal tenderness. There is no right CVA tenderness, left CVA tenderness, guarding or rebound.     Hernia: No hernia is present.  Musculoskeletal:        General: No swelling, tenderness, deformity or signs of injury. Normal range of motion.     Cervical back: Normal range of motion and neck supple. No rigidity or tenderness.     Right lower leg: No edema.     Left lower leg: No edema.  Lymphadenopathy:     Cervical: No cervical adenopathy.  Skin:    General: Skin is warm and dry.     Capillary Refill: Capillary refill takes less than 2 seconds.     Coloration: Skin is not jaundiced or pale.     Findings: No bruising, erythema, lesion or rash.  Neurological:     General: No focal deficit present.     Mental Status: She is alert and oriented to person, place, and time. Mental status is at baseline.     Cranial Nerves: No cranial nerve deficit.     Sensory: No sensory deficit.     Motor: No weakness.     Coordination: Coordination normal.     Gait: Gait normal.     Deep Tendon Reflexes: Reflexes normal.  Psychiatric:        Mood and Affect: Mood normal.        Behavior: Behavior normal.        Thought Content: Thought content normal.        Judgment: Judgment normal.     No results found for any visits on 09/01/21.    Assessment & Plan:    Routine Health Maintenance and Physical Exam   There is no immunization history on file for this patient.  Health Maintenance  Topic Date Due   PAP SMEAR-Modifier  09/01/2021 (Originally 06/10/2017)   COVID-19 Vaccine (1) 09/17/2021 (Originally [birth year]14/1967)   Zoster Vaccines- Shingrix (1 of 2) 12/02/2021 (Originally 12/14/2016)   TETANUS/TDAP  07/13/2022 (Originally 12/14/1985)   HIV Screening  07/13/2022 (Originally 12/14/1981)   COLONOSCOPY (Pts 45-52yrs Insurance coverage will need to be confirmed)  09/02/2022 (Originally 12/15/2011)   INFLUENZA  VACCINE  11/09/2021   MAMMOGRAM  02/02/2023   Hepatitis C Screening  Completed   HPV VACCINES  Aged Out    Discussed health benefits of physical activity, and encouraged her to engage in regular exercise appropriate for her age and condition.  Problem List Items Addressed  This Visit   None Visit Diagnoses     Annual physical exam    -  Primary   History of ear surgery       Relevant Orders   Ambulatory referral to ENT   Benign paroxysmal positional vertigo due to bilateral vestibular disorder       Relevant Orders   Ambulatory referral to ENT      Return in about 6 months (around 03/04/2022) for Chronic Conditions.     PLAN Exam unremarkable Reviewed labs with patient - she will continue healthy diet and exercise Refer to ENT for vertigo, hx of ear surgery Patient encouraged to call clinic with any questions, comments, or concerns.   Janeece Agee, NP

## 2021-09-09 ENCOUNTER — Telehealth: Payer: Self-pay | Admitting: Registered Nurse

## 2021-09-09 NOTE — Telephone Encounter (Signed)
I have placed a medical consultation report in the  bin upfront with charge sheet

## 2021-09-09 NOTE — Telephone Encounter (Signed)
I have received this paperwork and will give to to  Janeece Agee to complete.

## 2021-09-14 ENCOUNTER — Ambulatory Visit (HOSPITAL_COMMUNITY)
Admission: RE | Admit: 2021-09-14 | Discharge: 2021-09-14 | Disposition: A | Discharge status: Home / Self Care | Payer: 59 | Source: Ambulatory Visit | Attending: Registered Nurse | Admitting: Registered Nurse

## 2021-09-14 DIAGNOSIS — N6011 Diffuse cystic mastopathy of right breast: Secondary | ICD-10-CM | POA: Diagnosis not present

## 2021-09-14 DIAGNOSIS — R928 Other abnormal and inconclusive findings on diagnostic imaging of breast: Secondary | ICD-10-CM | POA: Diagnosis not present

## 2021-09-14 DIAGNOSIS — N6312 Unspecified lump in the right breast, upper inner quadrant: Secondary | ICD-10-CM | POA: Diagnosis not present

## 2021-09-14 DIAGNOSIS — N6310 Unspecified lump in the right breast, unspecified quadrant: Secondary | ICD-10-CM | POA: Insufficient documentation

## 2021-09-14 NOTE — Progress Notes (Deleted)
09/14/2021 Annette AgeeBelinda H Herrera [id number] 03/05/1966   CHIEF COMPLAINT:   HISTORY OF PRESENT ILLNESS:  Annette Herrera is a 56 year old female with a past medical history of anxiety, depression, fibromyalgia, arthritis, obesity, vertigo, GERD, iron deficiency anemia and GI bleed secondary to a NSAID induced gastric ulcer 02/2021  She presents to our office today as referred by Annette Ageeichard Morrow NP to schedule a colonoscopy.   She is taking Esomeprazole 40mg  QD   Hospital Course:  For full details, please see H&P, progress notes, consult notes and ancillary notes. Briefly, Annette Herrera is a 56 yrs old female with past medical history significant for remote GI bleed 15 to 20 years ago, fibromyalgia on NSAID was admitted as a transfer from [facility] for evaluation of hematemesis associated with nausea, vomiting, epigastric pain. Full H&P as per admission note on 11/19.   Acute upper GI bleeding secondary to NSAID induced gastric ulcer :  presented with multiple episodes of hematemesis associated with nausea, vomiting, epigastric discomfort  Has been taking NSAID for fibromyalgia  History of remote GI bleed in 15 to 20 years  Treated with IV fluid, IV PPI  EGD 11/21: Gastric ulcer, nonbleeding  Being discharged on pantoprazole 40 mg p.o. twice daily  GI will arrange for follow-up of with EGD in 8 weeks  GI consulted: N.p.o. from midnight, EGD tomorrow  Recommended not to take NSAID     Acute blood loss anemia:  This is secondary to above  Initially presented with hemoglobin 10.6 -->9.8-->8.5-->9.3  Hemoglobin progressively dropped and now improving  No acute bleeding now  Advised to follow-up with PCP to monitor CBC in 1 to 2 weeks   Cholelithiasis:  Patient reported having nausea and vomiting after eating pizza  CTAP at outside facility: Cholelithiasis with, wall thickening questionable for cholecystitis  Liver enzymes essentially normal, afebrile, no  leukocytosis, no focal tenderness to suspect cholecystitis  Lipase slightly 79, no clinical evidence of pancreatitis  Limited abdominal ultrasound: Cholelithiasis with mild gallbladder wall thickening may be secondary to under distention, no pericholecystic fluid on preliminary   Acute Hypomagnesemia: Magnesium 1.5 at outside facility status post 2 g IV, improving       Latest Ref Rng & Units 07/27/2021   12:09 PM 08/01/2014    5:15 AM 07/28/2014    1:35 PM  CBC  WBC 4.0 - 10.5 K/uL 5.7   11.3   5.9    Hemoglobin 12.0 - 15.0 g/dL 40.913.1   81.112.3   91.413.7    Hematocrit 36.0 - 46.0 % 39.6   36.4   41.4    Platelets 150.0 - 400.0 K/uL 275.0   324   335         Latest Ref Rng & Units 07/27/2021   12:09 PM 08/01/2014    5:15 AM 07/28/2014    1:35 PM  CMP  Glucose 70 - 99 mg/dL 782112   956124   213109    BUN 6 - 23 mg/dL 7   <5   8    Creatinine 0.40 - 1.20 mg/dL 0.860.59   5.780.70   4.690.74    Sodium 135 - 145 mEq/L 138   139   142    Potassium 3.5 - 5.1 mEq/L 4.5   4.0   4.4    Chloride 96 - 112 mEq/L 104   106   104    CO2 19 - 32 mEq/L 27   27   28  Calcium 8.4 - 10.5 mg/dL 8.8   8.4   9.4    Total Protein 6.0 - 8.3 g/dL 6.4    7.7    Total Bilirubin 0.2 - 1.2 mg/dL 0.4    0.4    Alkaline Phos 39 - 117 U/L 80    85    AST 0 - 37 U/L 10    33    ALT 0 - 35 U/L 11    42      H. Pylori stool antigen 02/27/2021: Negative  EGD 03/01/2021 at [facility]: Findings  The esophagus appeared normal. GE junction at 35 cm.  4 cm hiatal hernia  Ulcer with clean base (Forrest III); performed cold forceps biopsy. Ulcer  located just distal to the patients hiatal hernia within the stomach.  The duodenum appeared normal.  Impression  Normal esophagus and duodenum  4 cm hiatal hernia  Clean based ulcer in the stomach near and distal to the hiatal hernia  No signs of active bleed  Recommendation  Await pathology results  Continue PPI BID until repeat EGD in 8 weeks  Repeat EGD 8 weeks;  ordered  No NSAIDs     Gastric antral/oxyntic mucosa with ulceration with marked reactive epithelial changes.              No H. Pylori identified on H&E and immunohistochemical stain.                  Electronically signed by Lance Coon, MD on 03/03/2021 at  2:54 PM  Comment    Immunohistochemical stain for H. pylori was performed and is negative for H. pylori microorganisms.      CTAP with IV contrast 02/26/2021:  TECHNIQUE:  Multidetector CT imaging of the abdomen and pelvis was performed  using the standard protocol following bolus administration of  intravenous contrast.   CONTRAST:  100 cc Omnipaque 350   COMPARISON:  None.   FINDINGS:  Lower chest: The visualized lung bases are clear.   No intra-abdominal free air or free fluid.   Hepatobiliary: The liver is unremarkable. No intrahepatic biliary  ductal dilatation. Multiple stones noted within the gallbladder.  There is mild thickened appearance of the gallbladder wall with mild  haziness of the pericholecystic fat. Further evaluation with  ultrasound is recommended.   Pancreas: Unremarkable. No pancreatic ductal dilatation or  surrounding inflammatory changes.   Spleen: Normal in size without focal abnormality.   Adrenals/Urinary Tract: The adrenal glands are unremarkable. The  kidneys, visualized ureters, and the urinary bladder appear  unremarkable.   Stomach/Bowel: There is a moderate size hiatal hernia containing a  portion of the stomach. There is no bowel obstruction or active  inflammation. The appendix is normal.   Vascular/Lymphatic: Mild aortoiliac atherosclerotic disease. The IVC  is unremarkable. No portal venous gas. There is no adenopathy.   Reproductive: Hysterectomy.   Other: None   Musculoskeletal: Osteopenia with multilevel degenerative changes of  the spine. No acute osseous pathology.   IMPRESSION:  1. Cholelithiasis with findings concerning for acute cholecystitis.  Further  evaluation with ultrasound is recommended.  2. Moderate size hiatal hernia containing a portion of the stomach.  No bowel obstruction. Normal appendix.  3. Aortic Atherosclerosis (ICD10-I70.0).    Abdominal sono 02/27/2021:    Vascular: The inferior vena cava is patent. The visualized aorta is unremarkable.  .  Pancreas: Incompletely imaged. Visualized portions are unremarkable.  .  Liver: The liver measures 19.9 cm  in its craniocaudal dimension. Normal contour and echotexture. No focal lesions. The portal and hepatic venous systems are patent with antegrade flow.  .  Gallbladder: Cholelithiasis. Mild gallbladder wall thickening measuring up to 4 mm. No pericholecystic fluid. Negative sonographic Murphy's sign.  .  Biliary: The maximum caliber of the visualized common bile duct is 3 mm. No intrahepatic or extrahepatic ductal dilatation.  .  Right kidney: The right kidney measures 11.8 cm in length. Normal size, contour, and echogenicity. No hydronephrosis or perinephric fluid. No focal mass is identified.  .  Peritoneum: No ascites.  .  Additional comments: None  Past Medical History:  Diagnosis Date   ADD (attention deficit disorder)    Allergy 2015   Anemia november 2022   Anxiety    Arthritis lower back and knees   1992   Cataract 1999   have not had surgery for them   Clotting disorder (HCC) 2022   emergency bleeding ulcers in [facility] for 3/4 days   Depression    no psychologist x several years   Fibromyalgia    GERD (gastroesophageal reflux disease)    Headache    IBS (irritable bowel syndrome)    Insomnia    Morbid obesity with BMI of 45.0-49.9, adult (HCC)    PONV (postoperative nausea and vomiting)    Sleep apnea not diagnosed   I am not sure but i think i have this   Ulcer 02/2021   emergency hospitalization [facility] vomiting blood. large ulcer   Vertigo    s/p radical mastoidectomy L side.   Past Surgical History:  Procedure Laterality Date   ABDOMINAL  HYSTERECTOMY  2016   large overian tumor/endometriosis   ABLATION     ANKLE SURGERY     COLONOSCOPY     FRACTURE SURGERY  2006   ankle/ index finger   INNER EAR SURGERY     left index finger     ROBOTIC ASSISTED LAP VAGINAL HYSTERECTOMY Left 07/31/2014   Procedure:  ROBOTIC ASSISTED TOTAL LAPAROSCOPIC HYSTERECTOMY WITH LEFT SALPINGO OOPHORECTOMY LEFT URETEROLYSIS RIGHT SALPINGECTOMY ;  Surgeon: Adolphus Birchwood, MD;  Location: [facility];  Service: Gynecology;  Laterality: Left;   TONSILLECTOMY     TUBAL LIGATION     Social History:  Family History:    reports that she quit smoking about 8 years ago. Her smoking use included cigarettes. She has never used smokeless tobacco. She reports current drug use. Drug: Marijuana. She reports that she does not drink alcohol. family history includes ADD / ADHD in her daughter and maternal uncle; Alcohol abuse in her paternal grandfather; Anxiety disorder in her mother; Arthritis in her maternal grandmother and mother; Asthma in her maternal aunt; COPD in her mother; Depression in her daughter, father, maternal grandmother, and mother; Diabetes in her maternal grandmother and mother; Drug abuse in her father; Hearing loss in her father; Heart disease in her father, maternal aunt, and paternal grandmother; Hypertension in her father and mother; Intellectual disability in her maternal aunt; Learning disabilities in her maternal aunt; Obesity in her paternal grandmother; Varicose Veins in her maternal aunt and maternal grandmother.  Allergies  Allergen Reactions   Penicillins       Outpatient Encounter Medications as of 09/15/2021  Medication Sig   dexamethasone (DECADRON) 4 MG tablet Take 4 mg by mouth 3 (three) times daily.   esomeprazole (NEXIUM) 40 MG capsule Take 1 capsule (40 mg total) by mouth daily.   ibuprofen (ADVIL) 400 MG tablet Take 400 mg  by mouth every 4 (four) hours as needed.   meclizine (ANTIVERT) 25 MG tablet Take 1 tablet (25 mg total) by  mouth 2 (two) times daily as needed for dizziness.   pantoprazole (PROTONIX) 40 MG tablet Take 40 mg by mouth 2 (two) times daily.   No facility-administered encounter medications on file as of 09/15/2021.     REVIEW OF SYSTEMS: All other systems reviewed and negative except where noted in the History of Present Illness. Gen: Denies fever, sweats or chills. No weight loss.  CV: Denies chest pain, palpitations or edema. Resp: Denies cough, shortness of breath of hemoptysis.  GI: Denies heartburn, dysphagia, stomach or lower abdominal pain. No diarrhea or constipation.  GU : Denies urinary burning, blood in urine, increased urinary frequency or incontinence. MS: Denies joint pain, muscles aches or weakness. Derm: Denies rash, itchiness, skin lesions or unhealing ulcers. Psych: Denies depression, anxiety, memory loss, suicidal ideation and confusion. Heme: Denies bruising, easy bleeding. Neuro:  Denies headaches, dizziness or paresthesias. Endo:  Denies any problems with DM, thyroid or adrenal function.  PHYSICAL EXAM: LMP 07/28/2014 (Exact Date)  General: Well developed ... in no acute distress. Head: Normocephalic and atraumatic. Eyes:  Sclerae non-icteric, conjunctive pink. Ears: Normal auditory acuity. Mouth: Dentition intact. No ulcers or lesions.  Neck: Supple, no lymphadenopathy or thyromegaly.  Lungs: Clear bilaterally to auscultation without wheezes, crackles or rhonchi. Heart: Regular rate and rhythm. No murmur, rub or gallop appreciated.  Abdomen: Soft, nontender, non distended. No masses. No hepatosplenomegaly. Normoactive bowel sounds x 4 quadrants.  Rectal:  Musculoskeletal: Symmetrical with no gross deformities. Skin: Warm and dry. No rash or lesions on visible extremities. Extremities: No edema. Neurological: Alert oriented x 4, no focal deficits.  Psychological:  Alert and cooperative. Normal mood and affect.  ASSESSMENT AND PLAN:  61) 56 year old female presents to  schedule a screening colonoscopy  -Colonoscopy   2) UGI bleed secondary to NSAID induced gastric ulcer 02/2021 -EGD  3) Gallstones       CC:  Annette Agee, NP

## 2021-09-15 ENCOUNTER — Ambulatory Visit: Payer: 59 | Admitting: Nurse Practitioner

## 2021-09-22 ENCOUNTER — Telehealth: Payer: Self-pay

## 2021-09-22 ENCOUNTER — Telehealth: Payer: Self-pay | Admitting: Registered Nurse

## 2021-09-22 NOTE — Telephone Encounter (Signed)
Attempted to call patient to let her know I have faxed labs to [facility] at oral surgery.

## 2021-09-22 NOTE — Telephone Encounter (Signed)
I have faxed the form they just need labs added

## 2021-09-22 NOTE — Telephone Encounter (Signed)
error 

## 2021-09-22 NOTE — Telephone Encounter (Signed)
Labs have been printed and will be faxed over.

## 2021-09-22 NOTE — Telephone Encounter (Signed)
Richard patient. [facility] called stating that they fax a form over on 09/09/21 to clear pt for surgery and they needed pt most recent labs. Pt appt is 09/28/21.

## 2021-09-22 NOTE — Telephone Encounter (Signed)
Can you check if these forms were received

## 2021-09-23 ENCOUNTER — Telehealth: Payer: Self-pay | Admitting: Registered Nurse

## 2021-09-23 NOTE — Telephone Encounter (Signed)
Called stating that they need a more recent lab results for pt. We faxed over lab results for 07/28/21. Pt stats that she should have  lab results from last visit 09/01/21. If so [facility] want it to be fax to [phone].

## 2021-09-23 NOTE — Telephone Encounter (Signed)
07/28/21 are the most recent labs, there were none performed 09/01/21 Faxed back cover sheet stating this, they will have to draw labs if they want more recent or other lab work. We do not perform labs on behalf of other offices

## 2021-10-23 ENCOUNTER — Other Ambulatory Visit: Payer: Self-pay | Admitting: Registered Nurse

## 2021-10-23 DIAGNOSIS — G894 Chronic pain syndrome: Secondary | ICD-10-CM

## 2022-03-09 ENCOUNTER — Ambulatory Visit: Payer: 59 | Admitting: Registered Nurse

## 2022-05-27 ENCOUNTER — Ambulatory Visit (INDEPENDENT_AMBULATORY_CARE_PROVIDER_SITE_OTHER): Payer: 59

## 2022-05-27 ENCOUNTER — Ambulatory Visit: Payer: 59 | Admitting: Orthopedic Surgery

## 2022-05-27 ENCOUNTER — Encounter: Payer: Self-pay | Admitting: Orthopedic Surgery

## 2022-05-27 VITALS — BP 150/90 | HR 87 | Ht 65.0 in | Wt 268.0 lb

## 2022-05-27 DIAGNOSIS — S8992XA Unspecified injury of left lower leg, initial encounter: Secondary | ICD-10-CM | POA: Diagnosis not present

## 2022-05-27 DIAGNOSIS — R457 State of emotional shock and stress, unspecified: Secondary | ICD-10-CM | POA: Diagnosis not present

## 2022-05-27 DIAGNOSIS — R69 Illness, unspecified: Secondary | ICD-10-CM | POA: Diagnosis not present

## 2022-05-27 DIAGNOSIS — M1712 Unilateral primary osteoarthritis, left knee: Secondary | ICD-10-CM

## 2022-05-27 NOTE — Patient Instructions (Signed)
Dr Doren Custard / Dr Posey Pronto for primary care   [facility] [phone]

## 2022-05-27 NOTE — Progress Notes (Signed)
New patient  Chief Complaint  Patient presents with   Knee Pain    L knee pain after twisting injury first week of Oct '23. States she also heard a pop.    57 year old female with history of previous patella fracture as a adolescent presents with increasing pain since October 2023 when she felt a pop in her left knee after twisting injury.  She had had been having some issues since her surgical procedures prior to that but nothing like what she fell on October 23  Since that time she has had worsening pain worsening function and decreased ability to perform activities of daily living comes in with anterior and lateral joint pain in the left knee  No prior treatment.  Review of systems patient has fibromyalgia has a history of bleeding ulcers recently from ibuprofen  No chest pain or shortness of breath  Past Medical History:  Diagnosis Date   ADD (attention deficit disorder)    Allergy 2015   Anemia november 2022   Anxiety    Arthritis lower back and knees   [place]   have not had surgery for them   Clotting disorder ([place]) 2022   emergency bleeding ulcers in [facility] for 3/4 days   Depression    no psychologist x several years   Fibromyalgia    GERD (gastroesophageal reflux disease)    Headache    IBS (irritable bowel syndrome)    Insomnia    Morbid obesity with BMI of 45.0-49.9, adult ([place])    PONV (postoperative nausea and vomiting)    Sleep apnea not diagnosed   I am not sure but i think i have this   Ulcer 02/2021   emergency hospitalization [facility] vomiting blood. large ulcer   Vertigo    s/p radical mastoidectomy L side.   BP (!) 150/90   Pulse 87   Ht 5' 5"$  (1.651 m)   Wt 268 lb (121.6 kg)   LMP 07/17/2014   BMI 44.60 kg/m   Physical Exam Vitals and nursing note reviewed.  Constitutional:      Appearance: Normal appearance.  HENT:     Head: Normocephalic and atraumatic.  Eyes:     General: No scleral icterus.       Right eye: No  discharge.        Left eye: No discharge.     Extraocular Movements: Extraocular movements intact.     Conjunctiva/sclera: Conjunctivae normal.     Pupils: Pupils are equal, round, and reactive to light.  Cardiovascular:     Rate and Rhythm: Normal rate.     Pulses: Normal pulses.  Musculoskeletal:     Right knee: No swelling, deformity, effusion, erythema, ecchymosis, lacerations, bony tenderness or crepitus. Normal range of motion. No tenderness. No LCL laxity, MCL laxity, ACL laxity or PCL laxity. Normal alignment, normal meniscus and normal patellar mobility.     Left knee: Crepitus present. No swelling, deformity, effusion, erythema, ecchymosis, lacerations or bony tenderness. Decreased range of motion. Tenderness present over the lateral joint line and patellar tendon. No LCL laxity, MCL laxity, ACL laxity or PCL laxity.Normal alignment, normal meniscus and normal patellar mobility.  Skin:    General: Skin is warm and dry.     Capillary Refill: Capillary refill takes less than 2 seconds.  Neurological:     General: No focal deficit present.     Mental Status: She is alert and oriented to person, place, and time.  Sensory: No sensory deficit.     Coordination: Coordination normal.     Gait: Gait normal.  Psychiatric:        Mood and Affect: Mood normal.        Behavior: Behavior normal.        Thought Content: Thought content normal.        Judgment: Judgment normal.    Imaging  Image of the left knee shows grade 4 arthritis of the patella with subluxation laterally.  Large osteophytes around the femur as well.  Significant joint space narrowing noted laterally.  Tibiofemoral joint also shows moderate to severe joint space narrowing especially laterally.  She has osteophytes on that side as well.  On the lateral view you can see that the patellofemoral joint is to severely diseased  Encounter Diagnoses  Name Primary?   Injury of left knee, initial encounter Yes   Primary  osteoarthritis of left knee    Emotional stress      I explained the situation to Ms. Skillman.  She basically has osteoarthritis of the left knee.  The only way to definitively fix this problem is to perform a knee replacement.  She does not have any meniscal signs to suggest meniscal tear.  However, her BMI is 44 would like it to be at least under 40 which would be approximately a weight of 235.  She is understanding of the risk and complications of high BMI related to total joint arthroplasty and is willing to try to lose weight  We will also take a 20 pound weight loss if she could do that  She is also having significant psychosocial emotional issues at this time basically because of depression because of the situation with her knee as it has caused her to not be able to function in her normal manner  I gave her a primary care list of physicians as she does not have a primary care doctor.  She would need to have some type of psychosocial treatment as well prior to knee surgery.

## 2022-06-02 ENCOUNTER — Inpatient Hospital Stay (HOSPITAL_COMMUNITY): Admission: RE | Admit: 2022-06-02 | Payer: 59 | Source: Ambulatory Visit

## 2022-06-02 DIAGNOSIS — Z1231 Encounter for screening mammogram for malignant neoplasm of breast: Secondary | ICD-10-CM

## 2022-06-09 ENCOUNTER — Encounter: Payer: Self-pay | Admitting: Radiology

## 2022-06-21 ENCOUNTER — Encounter: Payer: Self-pay | Admitting: Internal Medicine

## 2022-06-21 ENCOUNTER — Ambulatory Visit: Payer: 59 | Admitting: Internal Medicine

## 2022-06-21 VITALS — BP 113/64 | HR 64 | Ht 65.0 in | Wt 265.6 lb

## 2022-06-21 DIAGNOSIS — Z1329 Encounter for screening for other suspected endocrine disorder: Secondary | ICD-10-CM

## 2022-06-21 DIAGNOSIS — Z13220 Encounter for screening for lipoid disorders: Secondary | ICD-10-CM

## 2022-06-21 DIAGNOSIS — F419 Anxiety disorder, unspecified: Secondary | ICD-10-CM

## 2022-06-21 DIAGNOSIS — Z0001 Encounter for general adult medical examination with abnormal findings: Secondary | ICD-10-CM

## 2022-06-21 DIAGNOSIS — R7303 Prediabetes: Secondary | ICD-10-CM

## 2022-06-21 DIAGNOSIS — Z9889 Other specified postprocedural states: Secondary | ICD-10-CM

## 2022-06-21 DIAGNOSIS — Z1321 Encounter for screening for nutritional disorder: Secondary | ICD-10-CM | POA: Diagnosis not present

## 2022-06-21 DIAGNOSIS — F32A Depression, unspecified: Secondary | ICD-10-CM

## 2022-06-21 DIAGNOSIS — H8113 Benign paroxysmal vertigo, bilateral: Secondary | ICD-10-CM | POA: Diagnosis not present

## 2022-06-21 DIAGNOSIS — R42 Dizziness and giddiness: Secondary | ICD-10-CM | POA: Insufficient documentation

## 2022-06-21 HISTORY — DX: Encounter for general adult medical examination with abnormal findings: Z00.01

## 2022-06-21 MED ORDER — FLUOXETINE HCL 20 MG PO TABS: 20.0000 mg | ORAL_TABLET | Freq: Every day | ORAL | 3 refills | Status: DC

## 2022-06-21 NOTE — Assessment & Plan Note (Signed)
She endorses a history of vertigo and has a medical history significant for radical mastoidectomy on the left side in the setting of cholesteatoma.  She has requested a new ENT referral today for repeat evaluation. -ENT referral placed today

## 2022-06-21 NOTE — Patient Instructions (Signed)
It was a pleasure to see you today.  Thank you for giving [place] the opportunity to be involved in your care.  Below is a brief recap of your visit and next steps.  We will plan to see you again in 4 weeks.  Summary You have established care today We will check labs Start fluoxetine 20 mg daily I have placed referrals to counseling, ENT, and a dietician We will follow up in 4 weeks

## 2022-06-21 NOTE — Assessment & Plan Note (Signed)
Tearful during today's encounter.  PHQ-9 and GAD-7 scores are both elevated today.  She endorses a history of anxiety and depression and has previously been on multiple medications for treatment.  She endorses adverse side effects with Cymbalta and Wellbutrin.  She states that fluoxetine and Lexapro have been effective previously.  Denies SI/HI currently. -Restart fluoxetine 20 mg daily today. -I have also placed a referral to integrated behavioral health for counseling services -Follow-up in 4 weeks for reassessment

## 2022-06-21 NOTE — Assessment & Plan Note (Signed)
A1c 6.2 on labs from April 2023. -Repeat labs ordered today, including A1c

## 2022-06-21 NOTE — Progress Notes (Signed)
New Patient Office Visit  Subjective    Patient ID: [name], female    DOB: 05/04/1965  Age: 57 y.o. MRN: [id number]  CC:  Chief Complaint  Patient presents with   Establish Care    Patient is establishing care, was referred by Dr Annette Herrera. Patient needs to lose weight to have knee replacement and phycological help. She would like a referral to an ENT.   HPI Annette Herrera presents to establish care.  She is a 57 year old woman who endorses a past medical history significant for peptic ulcer disease, prediabetes, and depression.  She has most recently been followed by Maximiano Coss, NP at [facility].  Annette Herrera has multiple concerns to discuss today.  She became tearful during our discussion when reviewing her history of depression and current life circumstances.  She is under a significant amount of stress related to family issues and her quality of life is currently diminished because of persistent left knee pain in the setting of osteoarthritis.  She is interested in restarting antidepressant therapy today for management of depression and anxiety.  She additionally reports persistent left knee pain in the setting of a patellar subluxation that occurred in October.  She was unable to seek care at that time due to a lack of insurance.  She was recently seen by orthopedic surgery (Dr. Aline Herrera) and was recommended to undergo total knee replacement, however prior to surgery she will need to lose at least 30 pounds.  She is interested in starting a medication for weight loss to assist with this.  Annette Herrera currently works in Therapist, art.  She endorses smoking cigarettes and marijuana intermittently.  She denies alcohol use.  Acute concerns, chronic medical conditions, and outstanding preventative care items discussed today are individually addressed in A/P below.  Outpatient Encounter Medications as of 06/21/2022  Medication Sig   FLUoxetine  (PROZAC) 20 MG tablet Take 1 tablet (20 mg total) by mouth daily.   No facility-administered encounter medications on file as of 06/21/2022.    Past Medical History:  Diagnosis Date   ADD (attention deficit disorder)    Allergy 2015   Anemia november 2022   Anxiety    Arthritis lower back and knees   [place]   have not had surgery for them   Clotting disorder ([place]) 2022   emergency bleeding ulcers in [facility] for 3/4 days   Depression    no psychologist x several years   Encounter for general adult medical examination with abnormal findings 06/21/2022   Fibromyalgia    GERD (gastroesophageal reflux disease)    Headache    IBS (irritable bowel syndrome)    Insomnia    Morbid obesity with BMI of 45.0-49.9, adult (HCC)    PONV (postoperative nausea and vomiting)    Sleep apnea not diagnosed   I am not sure but i think i have this   Ulcer 02/2021   emergency hospitalization [facility] vomiting blood. large ulcer   Vertigo    s/p radical mastoidectomy L side.    Past Surgical History:  Procedure Laterality Date   ABDOMINAL HYSTERECTOMY  2016   large overian tumor/endometriosis   ABLATION     ANKLE SURGERY     COLONOSCOPY     FRACTURE SURGERY  2006   ankle/ index finger   INNER EAR SURGERY     left index finger     ROBOTIC ASSISTED LAP VAGINAL HYSTERECTOMY Left  07/31/2014   Procedure:  ROBOTIC ASSISTED TOTAL LAPAROSCOPIC HYSTERECTOMY WITH LEFT SALPINGO OOPHORECTOMY LEFT URETEROLYSIS RIGHT SALPINGECTOMY ;  Surgeon: Everitt Amber, MD;  Location: [facility];  Service: Gynecology;  Laterality: Left;   TONSILLECTOMY     TUBAL LIGATION      Family History  Problem Relation Age of Onset   Diabetes Mother    Anxiety disorder Mother    Arthritis Mother    COPD Mother    Depression Mother    Hypertension Mother    Diabetes Maternal Grandmother    Arthritis Maternal Grandmother    Depression Maternal Grandmother    Varicose Veins Maternal Grandmother    Depression  Father    Drug abuse Father    Hearing loss Father    Heart disease Father    Hypertension Father    Alcohol abuse Paternal Grandfather    Heart disease Paternal Grandmother    Obesity Paternal Grandmother    ADD / ADHD Maternal Uncle    ADD / ADHD Daughter    Depression Daughter    Asthma Maternal Aunt    Intellectual disability Maternal Aunt    Learning disabilities Maternal Aunt    Varicose Veins Maternal Aunt    Heart disease Maternal Aunt     Social History   Socioeconomic History   Marital status: Divorced    Spouse name: Not on file   Number of children: Not on file   Years of education: Not on file   Highest education level: Not on file  Occupational History   Not on file  Tobacco Use   Smoking status: Former    Types: Cigarettes    Quit date: 04/04/2013    Years since quitting: 9.2   Smokeless tobacco: Never   Tobacco comments:    I stoppped smoking April 2021  Substance and Sexual Activity   Alcohol use: No   Drug use: Yes    Types: Marijuana    Comment: marijuana 07/27/14   Sexual activity: Not Currently    Birth control/protection: Abstinence, Surgical    Comment: Abstinent for 3 years  Other Topics Concern   Not on file  Social History Narrative   Not on file   Social Determinants of Health   Financial Resource Strain: Not on file  Food Insecurity: Not on file  Transportation Needs: Not on file  Physical Activity: Not on file  Stress: Not on file  Social Connections: Not on file  Intimate Partner Violence: Not on file    Review of Systems  Constitutional:  Negative for chills and fever.  HENT:  Negative for sore throat.   Respiratory:  Negative for cough and shortness of breath.   Cardiovascular:  Negative for chest pain, palpitations and leg swelling.  Gastrointestinal:  Negative for abdominal pain, blood in stool, constipation, diarrhea, nausea and vomiting.  Genitourinary:  Negative for dysuria and hematuria.  Musculoskeletal:   Positive for joint pain (left knee). Negative for myalgias.  Skin:  Negative for itching and rash.  Neurological:  Negative for dizziness and headaches.  Psychiatric/Behavioral:  Positive for depression. Negative for suicidal ideas. The patient is nervous/anxious.         Objective    BP 113/64 (BP Location: Right Arm, Patient Position: Sitting, Cuff Size: Large)   Pulse 64   Ht '5\' 5"'$  (1.651 m)   Wt 265 lb 9.6 oz (120.5 kg)   LMP 07/17/2014   SpO2 96%   BMI 44.20 kg/m   Physical Exam Vitals reviewed.  Constitutional:      General: She is not in acute distress.    Appearance: Normal appearance. She is obese. She is not toxic-appearing.  HENT:     Head: Normocephalic and atraumatic.     Right Ear: External ear normal.     Left Ear: External ear normal.     Nose: Nose normal. No congestion or rhinorrhea.     Mouth/Throat:     Mouth: Mucous membranes are moist.     Pharynx: Oropharynx is clear. No oropharyngeal exudate or posterior oropharyngeal erythema.  Eyes:     General: No scleral icterus.    Extraocular Movements: Extraocular movements intact.     Conjunctiva/sclera: Conjunctivae normal.     Pupils: Pupils are equal, round, and reactive to light.  Cardiovascular:     Rate and Rhythm: Normal rate and regular rhythm.     Pulses: Normal pulses.     Heart sounds: Normal heart sounds. No murmur heard.    No friction rub. No gallop.  Pulmonary:     Effort: Pulmonary effort is normal.     Breath sounds: Normal breath sounds. No wheezing, rhonchi or rales.  Abdominal:     General: Abdomen is flat. Bowel sounds are normal. There is no distension.     Palpations: Abdomen is soft.     Tenderness: There is no abdominal tenderness.  Musculoskeletal:        General: No swelling. Normal range of motion.     Cervical back: Normal range of motion.     Right lower leg: No edema.     Left lower leg: No edema.  Lymphadenopathy:     Cervical: No cervical adenopathy.  Skin:     General: Skin is warm and dry.     Capillary Refill: Capillary refill takes less than 2 seconds.     Coloration: Skin is not jaundiced.  Neurological:     General: No focal deficit present.     Mental Status: She is alert and oriented to person, place, and time.  Psychiatric:        Behavior: Behavior normal.     Comments: Tearful during encounter   Last CBC Lab Results  Component Value Date   WBC 5.7 07/27/2021   HGB 13.1 07/27/2021   HCT 39.6 07/27/2021   MCV 84.2 07/27/2021   MCH 29.6 08/01/2014   RDW 16.3 (H) 07/27/2021   PLT 275.0 123456   Last metabolic panel Lab Results  Component Value Date   GLUCOSE 112 (H) 07/27/2021   NA 138 07/27/2021   K 4.5 07/27/2021   CL 104 07/27/2021   CO2 27 07/27/2021   BUN 7 07/27/2021   CREATININE 0.59 07/27/2021   GFRNONAA >90 08/01/2014   CALCIUM 8.8 07/27/2021   PROT 6.4 07/27/2021   ALBUMIN 3.9 07/27/2021   BILITOT 0.4 07/27/2021   ALKPHOS 80 07/27/2021   AST 10 07/27/2021   ALT 11 07/27/2021   ANIONGAP 6 08/01/2014   Last lipids Lab Results  Component Value Date   CHOL 158 07/27/2021   HDL 51.00 07/27/2021   LDLCALC 87 07/27/2021   TRIG 99.0 07/27/2021   CHOLHDL 3 07/27/2021   Last hemoglobin A1c Lab Results  Component Value Date   HGBA1C 6.2 07/27/2021   Last thyroid functions Lab Results  Component Value Date   TSH 1.56 07/27/2021   Assessment & Plan:   Problem List Items Addressed This Visit       Morbid obesity ([place])    BMI  44.2 today.  She will need to lose weight prior to undergoing knee replacement surgery.  She is not following any dieting habits currently and her exercise capacity is limited secondary to left knee pain.  She is interested in starting a medication for weight loss. -I reviewed with Annette Herrera that any medication prescribed for weight loss will only be effective with appropriate lifestyle modifications aimed at weight loss.  With this in mind, I have placed a referral to a  dietitian.  Baseline labs have been ordered today.  We will tentatively plan for follow-up in 4 weeks and discuss starting a weekly injectable medication for weight loss at that time.  Ideally he can start Wegovy or Zepbound.       Anxiety and depression - Primary    Tearful during today's encounter.  PHQ-9 and GAD-7 scores are both elevated today.  She endorses a history of anxiety and depression and has previously been on multiple medications for treatment.  She endorses adverse side effects with Cymbalta and Wellbutrin.  She states that fluoxetine and Lexapro have been effective previously.  Denies SI/HI currently. -Restart fluoxetine 20 mg daily today. -I have also placed a referral to integrated behavioral health for counseling services -Follow-up in 4 weeks for reassessment       Prediabetes    A1c 6.2 on labs from April 2023. -Repeat labs ordered today, including A1c      Encounter for general adult medical examination with abnormal findings    Presenting today to establish care.  Previous records and labs been reviewed. -Baseline labs ordered today -Plan to address outstanding preventative care items at follow-up in 4 weeks.      Vertigo    She endorses a history of vertigo and has a medical history significant for radical mastoidectomy on the left side in the setting of cholesteatoma.  She has requested a new ENT referral today for repeat evaluation. -ENT referral placed today      Return in about 4 weeks (around 07/19/2022) for Anxiety/Depression, Obesity, Lab Review.   Johnette Abraham, MD

## 2022-06-21 NOTE — Assessment & Plan Note (Signed)
Presenting today to establish care.  Previous records and labs been reviewed. -Baseline labs ordered today -Plan to address outstanding preventative care items at follow-up in 4 weeks.

## 2022-06-21 NOTE — Assessment & Plan Note (Signed)
BMI 44.2 today.  She will need to lose weight prior to undergoing knee replacement surgery.  She is not following any dieting habits currently and her exercise capacity is limited secondary to left knee pain.  She is interested in starting a medication for weight loss. -I reviewed with Annette Herrera that any medication prescribed for weight loss will only be effective with appropriate lifestyle modifications aimed at weight loss.  With this in mind, I have placed a referral to a dietitian.  Baseline labs have been ordered today.  We will tentatively plan for follow-up in 4 weeks and discuss starting a weekly injectable medication for weight loss at that time.  Ideally he can start Wegovy or Zepbound.

## 2022-06-22 ENCOUNTER — Other Ambulatory Visit: Payer: Self-pay | Admitting: Internal Medicine

## 2022-06-22 DIAGNOSIS — E559 Vitamin D deficiency, unspecified: Secondary | ICD-10-CM

## 2022-06-22 LAB — CMP14+EGFR
ALT: 13 IU/L (ref 0–32)
AST: 11 IU/L (ref 0–40)
Albumin/Globulin Ratio: 1.9 (ref 1.2–2.2)
Albumin: 4.3 g/dL (ref 3.8–4.9)
Alkaline Phosphatase: 98 IU/L (ref 44–121)
BUN/Creatinine Ratio: 14 (ref 9–23)
BUN: 10 mg/dL (ref 6–24)
Bilirubin Total: 0.3 mg/dL (ref 0.0–1.2)
CO2: 24 mmol/L (ref 20–29)
Calcium: 9.4 mg/dL (ref 8.7–10.2)
Chloride: 103 mmol/L (ref 96–106)
Creatinine, Ser: 0.71 mg/dL (ref 0.57–1.00)
Globulin, Total: 2.3 g/dL (ref 1.5–4.5)
Glucose: 78 mg/dL (ref 70–99)
Potassium: 4.8 mmol/L (ref 3.5–5.2)
Sodium: 140 mmol/L (ref 134–144)
Total Protein: 6.6 g/dL (ref 6.0–8.5)
eGFR: 100 mL/min/{1.73_m2} (ref >59)

## 2022-06-22 LAB — VITAMIN D 25 HYDROXY (VIT D DEFICIENCY, FRACTURES): Vit D, 25-Hydroxy: 12.9 ng/mL — ABNORMAL LOW (ref 30.0–100.0)

## 2022-06-22 LAB — LIPID PANEL
Chol/HDL Ratio: 4.2 ratio (ref 0.0–4.4)
Cholesterol, Total: 208 mg/dL — ABNORMAL HIGH (ref 100–199)
HDL: 50 mg/dL (ref >39)
LDL Chol Calc (NIH): 131 mg/dL — ABNORMAL HIGH (ref 0–99)
Triglycerides: 153 mg/dL — ABNORMAL HIGH (ref 0–149)
VLDL Cholesterol Cal: 27 mg/dL (ref 5–40)

## 2022-06-22 LAB — CBC WITH DIFFERENTIAL/PLATELET
Basophils Absolute: 0.1 10*3/uL (ref 0.0–0.2)
Basos: 1 %
EOS (ABSOLUTE): 0.2 10*3/uL (ref 0.0–0.4)
Eos: 3 %
Hematocrit: 45.7 % (ref 34.0–46.6)
Hemoglobin: 15.2 g/dL (ref 11.1–15.9)
Immature Grans (Abs): 0 10*3/uL (ref 0.0–0.1)
Immature Granulocytes: 0 %
Lymphocytes Absolute: 2.8 10*3/uL (ref 0.7–3.1)
Lymphs: 39 %
MCH: 30.2 pg (ref 26.6–33.0)
MCHC: 33.3 g/dL (ref 31.5–35.7)
MCV: 91 fL (ref 79–97)
Monocytes Absolute: 0.5 10*3/uL (ref 0.1–0.9)
Monocytes: 7 %
Neutrophils Absolute: 3.6 10*3/uL (ref 1.4–7.0)
Neutrophils: 50 %
Platelets: 315 10*3/uL (ref 150–450)
RBC: 5.04 x10E6/uL (ref 3.77–5.28)
RDW: 12.7 % (ref 11.7–15.4)
WBC: 7.1 10*3/uL (ref 3.4–10.8)

## 2022-06-22 LAB — HEMOGLOBIN A1C
Est. average glucose Bld gHb Est-mCnc: 111 mg/dL
Hgb A1c MFr Bld: 5.5 % (ref 4.8–5.6)

## 2022-06-22 LAB — TSH+FREE T4
Free T4: 0.92 ng/dL (ref 0.82–1.77)
TSH: 2.34 u[IU]/mL (ref 0.450–4.500)

## 2022-06-22 LAB — B12 AND FOLATE PANEL
Folate: 3.8 ng/mL (ref >3.0)
Vitamin B-12: 288 pg/mL (ref 232–1245)

## 2022-06-22 MED ORDER — VITAMIN D (ERGOCALCIFEROL) 1.25 MG (50000 UNIT) PO CAPS: 50000.0000 [IU] | ORAL_CAPSULE | ORAL | 0 refills | Status: AC

## 2022-07-07 ENCOUNTER — Ambulatory Visit (INDEPENDENT_AMBULATORY_CARE_PROVIDER_SITE_OTHER): Payer: 59 | Admitting: Licensed Clinical Social Worker

## 2022-07-07 DIAGNOSIS — F320 Major depressive disorder, single episode, mild: Secondary | ICD-10-CM | POA: Diagnosis not present

## 2022-07-12 NOTE — BH Specialist Note (Signed)
Integrated Behavioral Health via Telemedicine Visit  07/12/2022 LI [address] [id number]  Number of [place] Clinician visits: 1 Session Start time:  2:15pm Session End time: 2:55pm Total time in minutes: 40 mins via mychart video   Referring Provider: Dr. Doren Custard  Patient/Family location: Home  [facility] Provider location: [place]  All persons participating in visit: Pt B Annette Herrera and LCSW A Annette Herrera  Types of Service: Individual psychotherapy and Video visit  I connected with Annette Herrera and/or Annette Herrera via  Telephone or Geologist, engineering  (Video is Caregility application) and verified that I am speaking with the correct person using two identifiers. Discussed confidentiality: Yes   I discussed the limitations of telemedicine and the availability of in person appointments.  Discussed there is a possibility of technology failure and discussed alternative modes of communication if that failure occurs.  I discussed that engaging in this telemedicine visit, they consent to the provision of behavioral healthcare and the services will be billed under their insurance.  Patient and/or legal guardian expressed understanding and consented to Telemedicine visit: Yes   Presenting Concerns: Patient and/or family reports the following symptoms/concerns: depression Duration of problem: over one year ; Severity of problem: mild  Patient and/or Family's Strengths/Protective Factors: Concrete supports in place (healthy food, safe environments, etc.)  Goals Addressed: Patient will:  Reduce symptoms of: depression   Increase knowledge and/or ability of: coping skills   Demonstrate ability to: Increase healthy adjustment to current life circumstances  Progress towards Goals: Ongoing  Interventions: Interventions utilized:  Motivational Interviewing and Supportive Counseling Standardized Assessments completed: PHQ 9  Patient and/or Family  Response: Annette Herrera reports depressed mood most days, negative thought patterns, tearful, and difficulty sleeping.   Assessment: Patient currently experiencing major depressive disorder.   Patient may benefit from integrated behavioral health.  Plan: Follow up with behavioral health clinician on : 2-3 weeks mychart video Behavioral recommendations: Counteract negative thought patterns, positive self affirmations, engage in self care activities support/peer groups, new interest and hobbies  Referral(s): [place] (In Clinic)  I discussed the assessment and treatment plan with the patient and/or parent/guardian. They were provided an opportunity to ask questions and all were answered. They agreed with the plan and demonstrated an understanding of the instructions.   They were advised to call back or seek an in-person evaluation if the symptoms worsen or if the condition fails to improve as anticipated.  Annette Ferrier, LCSW

## 2022-07-15 ENCOUNTER — Encounter: Payer: Self-pay | Admitting: Internal Medicine

## 2022-07-19 ENCOUNTER — Encounter: Payer: Self-pay | Admitting: Internal Medicine

## 2022-07-19 ENCOUNTER — Ambulatory Visit: Payer: 59 | Admitting: Internal Medicine

## 2022-07-19 VITALS — BP 122/72 | HR 78 | Ht 65.0 in | Wt 266.2 lb

## 2022-07-19 DIAGNOSIS — R7303 Prediabetes: Secondary | ICD-10-CM

## 2022-07-19 DIAGNOSIS — F419 Anxiety disorder, unspecified: Secondary | ICD-10-CM | POA: Diagnosis not present

## 2022-07-19 DIAGNOSIS — E559 Vitamin D deficiency, unspecified: Secondary | ICD-10-CM | POA: Diagnosis not present

## 2022-07-19 DIAGNOSIS — F32A Depression, unspecified: Secondary | ICD-10-CM | POA: Diagnosis not present

## 2022-07-19 DIAGNOSIS — E782 Mixed hyperlipidemia: Secondary | ICD-10-CM | POA: Diagnosis not present

## 2022-07-19 MED ORDER — SEMAGLUTIDE-WEIGHT MANAGEMENT 0.25 MG/0.5ML ~~LOC~~ SOAJ
0.2500 mg | SUBCUTANEOUS | 0 refills | Status: AC
Start: 2022-07-19 — End: 2022-08-16

## 2022-07-19 MED ORDER — FLUOXETINE HCL 40 MG PO CAPS
40.0000 mg | ORAL_CAPSULE | Freq: Every day | ORAL | 3 refills | Status: DC
Start: 2022-07-19 — End: 2023-03-02

## 2022-07-19 MED ORDER — HYDROXYZINE PAMOATE 25 MG PO CAPS
25.0000 mg | ORAL_CAPSULE | Freq: Three times a day (TID) | ORAL | 0 refills | Status: DC | PRN
Start: 2022-07-19 — End: 2022-08-22

## 2022-07-19 NOTE — Patient Instructions (Signed)
It was a pleasure to see you today.  Thank you for giving [place] the opportunity to be involved in your care.  Below is a brief recap of your visit and next steps.  We will plan to see you again in 3 months.  Summary Start [phone] for weight loss Increase Prozac to 40 mg daily Hydroxyzine 25 mg as needed for anxiety relief Follow up in 3 months

## 2022-07-19 NOTE — Progress Notes (Signed)
Established Patient Office Visit  Subjective   Patient ID: Annette Herrera, female    DOB: 17-Jun-1965  Age: 57 y.o. MRN: [id number]  Chief Complaint  Patient presents with   Depression    Follow up   [name] returns to care today for 4-week follow-up.  She was last evaluated by me on 3/12 as a new patient presenting to establish care.  At that time she endorsed significant anxiety and depression.  Fluoxetine was restarted and she was referred to integrated behavioral health.  She was also referred to ENT for evaluation of vertigo.  Baseline labs were ordered and 4-week follow-up was arranged.  In the interim she has establish care with [facility].  There have otherwise been no acute interval events.  [name] reports feeling fairly well today.  She states that her mood has improved since resuming Prozac.  Her anxiety remains poorly controlled.  She has no additional concerns to discuss today.  Past Medical History:  Diagnosis Date   ADD (attention deficit disorder)    Allergy 2015   Anemia november 2022   Anxiety    Arthritis lower back and knees   1992   Cataract 1999   have not had surgery for them   Clotting disorder 2022   emergency bleeding ulcers in [facility] for 3/4 days   Depression    no psychologist x several years   Encounter for general adult medical examination with abnormal findings 06/21/2022   Fibromyalgia    GERD (gastroesophageal reflux disease)    Headache    Hyperlipidemia 07/25/2022   IBS (irritable bowel syndrome)    Insomnia    Morbid obesity with BMI of 45.0-49.9, adult    PONV (postoperative nausea and vomiting)    Sleep apnea not diagnosed   I am not sure but i think i have this   Ulcer 02/2021   emergency hospitalization [facility] vomiting blood. large ulcer   Vertigo    s/p radical mastoidectomy L side.   Past Surgical History:  Procedure Laterality Date   ABDOMINAL HYSTERECTOMY  2016   large overian tumor/endometriosis   ABLATION     ANKLE SURGERY      COLONOSCOPY     FRACTURE SURGERY  2006   ankle/ index finger   INNER EAR SURGERY     left index finger     ROBOTIC ASSISTED LAP VAGINAL HYSTERECTOMY Left 07/31/2014   Procedure:  ROBOTIC ASSISTED TOTAL LAPAROSCOPIC HYSTERECTOMY WITH LEFT SALPINGO OOPHORECTOMY LEFT URETEROLYSIS RIGHT SALPINGECTOMY ;  Surgeon: Adolphus Birchwood, MD;  Location: [facility];  Service: Gynecology;  Laterality: Left;   TONSILLECTOMY     TUBAL LIGATION     Social History   Tobacco Use   Smoking status: Former    Types: Cigarettes    Quit date: 04/04/2013    Years since quitting: 9.3   Smokeless tobacco: Never   Tobacco comments:    I stoppped smoking April 2021  Substance Use Topics   Alcohol use: No   Drug use: Yes    Types: Marijuana    Comment: marijuana 07/27/14   Family History  Problem Relation Age of Onset   Diabetes Mother    Anxiety disorder Mother    Arthritis Mother    COPD Mother    Depression Mother    Hypertension Mother    Diabetes Maternal Grandmother    Arthritis Maternal Grandmother    Depression Maternal Grandmother    Varicose Veins Maternal Grandmother    Depression Father  Drug abuse Father    Hearing loss Father    Heart disease Father    Hypertension Father    Alcohol abuse Paternal Grandfather    Heart disease Paternal Grandmother    Obesity Paternal Grandmother    ADD / ADHD Maternal Uncle    ADD / ADHD Daughter    Depression Daughter    Asthma Maternal Aunt    Intellectual disability Maternal Aunt    Learning disabilities Maternal Aunt    Varicose Veins Maternal Aunt    Heart disease Maternal Aunt    Allergies  Allergen Reactions   Penicillins    Review of Systems  Psychiatric/Behavioral:  The patient is nervous/anxious.   All other systems reviewed and are negative.    Objective:     BP 122/72   Pulse 78   Ht  (1.651 m)   Wt 266 lb 3.2 oz (120.7 kg)   LMP 07/17/2014   SpO2 96%   BMI 44.30 kg/m  BP Readings from Last 3 Encounters:  07/19/22  122/72  06/21/22 113/64  05/27/22 (!) 150/90   Physical Exam Vitals reviewed.  Constitutional:      General: She is not in acute distress.    Appearance: Normal appearance. She is obese. She is not toxic-appearing.  HENT:     Head: Normocephalic and atraumatic.     Right Ear: External ear normal.     Left Ear: External ear normal.     Nose: Nose normal. No congestion or rhinorrhea.     Mouth/Throat:     Mouth: Mucous membranes are moist.     Pharynx: Oropharynx is clear. No oropharyngeal exudate or posterior oropharyngeal erythema.  Eyes:     General: No scleral icterus.    Extraocular Movements: Extraocular movements intact.     Conjunctiva/sclera: Conjunctivae normal.     Pupils: Pupils are equal, round, and reactive to light.  Cardiovascular:     Rate and Rhythm: Normal rate and regular rhythm.     Pulses: Normal pulses.     Heart sounds: Normal heart sounds. No murmur heard.    No friction rub. No gallop.  Pulmonary:     Effort: Pulmonary effort is normal.     Breath sounds: Normal breath sounds. No wheezing, rhonchi or rales.  Abdominal:     General: Abdomen is flat. Bowel sounds are normal. There is no distension.     Palpations: Abdomen is soft.     Tenderness: There is no abdominal tenderness.  Musculoskeletal:        General: No swelling. Normal range of motion.     Cervical back: Normal range of motion.     Right lower leg: No edema.     Left lower leg: No edema.  Lymphadenopathy:     Cervical: No cervical adenopathy.  Skin:    General: Skin is warm and dry.     Capillary Refill: Capillary refill takes less than 2 seconds.     Coloration: Skin is not jaundiced.  Neurological:     General: No focal deficit present.     Mental Status: She is alert and oriented to person, place, and time.  Psychiatric:        Mood and Affect: Mood normal.        Behavior: Behavior normal.   Last CBC Lab Results  Component Value Date   WBC 7.1 06/21/2022   HGB 15.2  06/21/2022   HCT 45.7 06/21/2022   MCV 91 06/21/2022   MCH  30.2 06/21/2022   RDW 12.7 06/21/2022   PLT 315 06/21/2022   Last metabolic panel Lab Results  Component Value Date   GLUCOSE 78 06/21/2022   NA 140 06/21/2022   K 4.8 06/21/2022   CL 103 06/21/2022   CO2 24 06/21/2022   BUN 10 06/21/2022   CREATININE 0.71 06/21/2022   EGFR 100 06/21/2022   CALCIUM 9.4 06/21/2022   PROT 6.6 06/21/2022   ALBUMIN 4.3 06/21/2022   LABGLOB 2.3 06/21/2022   AGRATIO 1.9 06/21/2022   BILITOT 0.3 06/21/2022   ALKPHOS 98 06/21/2022   AST 11 06/21/2022   ALT 13 06/21/2022   ANIONGAP 6 08/01/2014   Last lipids Lab Results  Component Value Date   CHOL 208 (H) 06/21/2022   HDL 50 06/21/2022   LDLCALC 131 (H) 06/21/2022   TRIG 153 (H) 06/21/2022   CHOLHDL 4.2 06/21/2022   Last hemoglobin A1c Lab Results  Component Value Date   HGBA1C 5.5 06/21/2022   Last thyroid functions Lab Results  Component Value Date   TSH 2.340 06/21/2022   Last vitamin D Lab Results  Component Value Date   VD25OH 12.9 (L) 06/21/2022   Last vitamin B12 and Folate Lab Results  Component Value Date   VITAMINB12 288 06/21/2022   FOLATE 3.8 06/21/2022   The 10-year ASCVD risk score (Arnett DK, et al., 2019) is: 2.1%    Assessment & Plan:   Problem List Items Addressed This Visit       Morbid obesity    BMI 44.3 today.  She has previously expressed an interest in starting a medication for weight loss. -Medication options reviewed.  Will give he has been prescribed today. -We will tentatively plan for follow-up in 3 months for reassessment.      Anxiety and depression - Primary    Prozac 20 mg daily was restarted at her last appointment as she endorsed poorly controlled symptoms of anxiety and depression.  She was also referred to integrated behavioral health and has since establish care.  She states that her mood has significantly improved.  She feels that counseling has been beneficial.  She  continues to endorse poorly controlled symptoms of anxiety.  GAD-7 score remains elevated today.  She believes that further increasing Prozac will help with this. -Increase Prozac to 40 mg daily -Add hydroxyzine 25 mg as needed for anxiety relief      Vitamin D deficiency    Noted on labs from last month.  She is currently on high-dose, weekly vitamin D supplementation. -Repeat vitamin D level upon completion of weekly vitamin D supplementation.      Hyperlipidemia    Lipid panel updated last month.  Total cholesterol 208 and LDL 131.  Her 10-year ASCVD risk or today is 2.1%. -We discussed lifestyle modifications aimed at weight loss and improving her cholesterol.  Mediterranean diet recommended.  She has also previously been referred to medical nutrition therapy and has an appointment scheduled next month.       Return in about 3 months (around 10/18/2022).    Billie Lade, MD

## 2022-07-20 ENCOUNTER — Encounter: Payer: Self-pay | Admitting: Internal Medicine

## 2022-07-21 ENCOUNTER — Encounter: Payer: 59 | Admitting: Licensed Clinical Social Worker

## 2022-07-25 ENCOUNTER — Encounter: Payer: Self-pay | Admitting: Internal Medicine

## 2022-07-25 DIAGNOSIS — E559 Vitamin D deficiency, unspecified: Secondary | ICD-10-CM | POA: Insufficient documentation

## 2022-07-25 DIAGNOSIS — E785 Hyperlipidemia, unspecified: Secondary | ICD-10-CM

## 2022-07-25 HISTORY — DX: Hyperlipidemia, unspecified: E78.5

## 2022-07-25 NOTE — Assessment & Plan Note (Signed)
Lipid panel updated last month.  Total cholesterol 208 and LDL 131.  Her 10-year ASCVD risk or today is 2.1%. -We discussed lifestyle modifications aimed at weight loss and improving her cholesterol.  Mediterranean diet recommended.  She has also previously been referred to medical nutrition therapy and has an appointment scheduled next month.

## 2022-07-25 NOTE — Assessment & Plan Note (Signed)
Prozac 20 mg daily was restarted at her last appointment as she endorsed poorly controlled symptoms of anxiety and depression.  She was also referred to integrated behavioral health and has since establish care.  She states that her mood has significantly improved.  She feels that counseling has been beneficial.  She continues to endorse poorly controlled symptoms of anxiety.  GAD-7 score remains elevated today.  She believes that further increasing Prozac will help with this. -Increase Prozac to 40 mg daily -Add hydroxyzine 25 mg as needed for anxiety relief

## 2022-07-25 NOTE — Assessment & Plan Note (Signed)
Noted on labs from last month.  She is currently on high-dose, weekly vitamin D supplementation. -Repeat vitamin D level upon completion of weekly vitamin D supplementation.

## 2022-07-25 NOTE — Assessment & Plan Note (Signed)
BMI 44.3 today.  She has previously expressed an interest in starting a medication for weight loss. -Medication options reviewed.  Will give he has been prescribed today. -We will tentatively plan for follow-up in 3 months for reassessment.

## 2022-08-18 ENCOUNTER — Ambulatory Visit: Payer: 59 | Admitting: Nutrition

## 2022-08-20 ENCOUNTER — Other Ambulatory Visit: Payer: Self-pay | Admitting: Internal Medicine

## 2022-08-20 DIAGNOSIS — F32A Depression, unspecified: Secondary | ICD-10-CM

## 2022-10-24 ENCOUNTER — Ambulatory Visit: Payer: 59 | Admitting: Internal Medicine

## 2023-02-17 ENCOUNTER — Other Ambulatory Visit (HOSPITAL_COMMUNITY): Payer: Self-pay | Admitting: Internal Medicine

## 2023-02-17 DIAGNOSIS — Z1231 Encounter for screening mammogram for malignant neoplasm of breast: Secondary | ICD-10-CM

## 2023-02-22 ENCOUNTER — Inpatient Hospital Stay (HOSPITAL_COMMUNITY): Admission: RE | Admit: 2023-02-22 | Payer: 59 | Source: Ambulatory Visit

## 2023-02-22 DIAGNOSIS — Z1231 Encounter for screening mammogram for malignant neoplasm of breast: Secondary | ICD-10-CM

## 2023-03-02 ENCOUNTER — Other Ambulatory Visit: Payer: Self-pay | Admitting: Internal Medicine

## 2023-03-02 DIAGNOSIS — F32A Depression, unspecified: Secondary | ICD-10-CM

## 2023-03-02 DIAGNOSIS — F419 Anxiety disorder, unspecified: Secondary | ICD-10-CM

## 2023-04-10 ENCOUNTER — Encounter: Payer: 59 | Admitting: Internal Medicine

## 2023-04-11 ENCOUNTER — Other Ambulatory Visit (HOSPITAL_COMMUNITY): Payer: Self-pay | Admitting: Internal Medicine

## 2023-04-11 DIAGNOSIS — Z1231 Encounter for screening mammogram for malignant neoplasm of breast: Secondary | ICD-10-CM

## 2023-04-17 ENCOUNTER — Inpatient Hospital Stay (HOSPITAL_COMMUNITY): Admission: RE | Admit: 2023-04-17 | Payer: 59 | Source: Ambulatory Visit

## 2023-04-17 DIAGNOSIS — Z1231 Encounter for screening mammogram for malignant neoplasm of breast: Secondary | ICD-10-CM

## 2023-04-19 ENCOUNTER — Encounter: Payer: Self-pay | Admitting: Internal Medicine

## 2023-04-26 ENCOUNTER — Encounter (HOSPITAL_COMMUNITY): Payer: 59

## 2023-05-01 ENCOUNTER — Other Ambulatory Visit (HOSPITAL_COMMUNITY): Payer: Self-pay | Admitting: Internal Medicine

## 2023-05-01 DIAGNOSIS — Z1231 Encounter for screening mammogram for malignant neoplasm of breast: Secondary | ICD-10-CM

## 2023-05-08 ENCOUNTER — Inpatient Hospital Stay (HOSPITAL_COMMUNITY): Admission: RE | Admit: 2023-05-08 | Payer: 59 | Source: Ambulatory Visit

## 2023-05-08 DIAGNOSIS — Z1231 Encounter for screening mammogram for malignant neoplasm of breast: Secondary | ICD-10-CM

## 2023-05-18 ENCOUNTER — Telehealth: Payer: Self-pay

## 2023-05-18 ENCOUNTER — Encounter: Payer: Self-pay | Admitting: Internal Medicine

## 2023-05-18 DIAGNOSIS — Z9189 Other specified personal risk factors, not elsewhere classified: Secondary | ICD-10-CM

## 2023-05-18 MED ORDER — OSELTAMIVIR PHOSPHATE 75 MG PO CAPS: 75.0000 mg | ORAL_CAPSULE | Freq: Two times a day (BID) | ORAL | 0 refills | Status: AC

## 2023-05-18 NOTE — Telephone Encounter (Signed)
 Copied from CRM [id number]. Topic: Clinical - Medical Advice >> May 18, 2023  3:55 PM Alfonso ORN wrote: Reason for CRM: Patient was expose to Aunt and some relative who as Type A Flu  Patient want to know if there something she can take to prevent the flu or medication  Need advice , please call patient [phone]

## 2023-05-31 ENCOUNTER — Encounter: Payer: 59 | Admitting: Internal Medicine

## 2023-06-21 ENCOUNTER — Other Ambulatory Visit: Payer: Self-pay | Admitting: Internal Medicine

## 2023-06-21 DIAGNOSIS — F32A Depression, unspecified: Secondary | ICD-10-CM

## 2023-06-21 MED ORDER — FLUOXETINE HCL 40 MG PO CAPS: 40.0000 mg | ORAL_CAPSULE | Freq: Every day | ORAL | 0 refills | Status: DC

## 2023-07-10 ENCOUNTER — Telehealth: Admitting: Physician Assistant

## 2023-07-10 DIAGNOSIS — M549 Dorsalgia, unspecified: Secondary | ICD-10-CM

## 2023-07-10 MED ORDER — NAPROXEN 500 MG PO TABS: 500.0000 mg | ORAL_TABLET | Freq: Two times a day (BID) | ORAL | 0 refills | Status: DC

## 2023-07-10 MED ORDER — CYCLOBENZAPRINE HCL 10 MG PO TABS
5.0000 mg | ORAL_TABLET | Freq: Three times a day (TID) | ORAL | 0 refills | Status: DC | PRN
Start: 2023-07-10 — End: 2023-09-12

## 2023-07-10 NOTE — Progress Notes (Signed)

## 2023-07-21 ENCOUNTER — Encounter: Payer: 59 | Admitting: Internal Medicine

## 2023-08-01 ENCOUNTER — Encounter: Payer: Self-pay | Admitting: Internal Medicine

## 2023-08-09 ENCOUNTER — Other Ambulatory Visit: Payer: Self-pay | Admitting: Internal Medicine

## 2023-08-09 ENCOUNTER — Telehealth: Payer: Self-pay | Admitting: Internal Medicine

## 2023-08-09 ENCOUNTER — Other Ambulatory Visit (HOSPITAL_COMMUNITY): Payer: Self-pay | Admitting: Internal Medicine

## 2023-08-09 ENCOUNTER — Telehealth: Payer: Self-pay

## 2023-08-09 DIAGNOSIS — Z1231 Encounter for screening mammogram for malignant neoplasm of breast: Secondary | ICD-10-CM

## 2023-08-09 DIAGNOSIS — H8110 Benign paroxysmal vertigo, unspecified ear: Secondary | ICD-10-CM

## 2023-08-09 DIAGNOSIS — R42 Dizziness and giddiness: Secondary | ICD-10-CM

## 2023-08-09 NOTE — Telephone Encounter (Signed)
 Ent  referral was placed

## 2023-08-09 NOTE — Telephone Encounter (Signed)
 Copied from CRM [id number]. Topic: Referral - Request for Referral >> Aug 09, 2023  9:00 AM Rennis Case wrote: Did the patient discuss referral with their provider in the last year? Yes  Appointment offered? No  Type of order/referral and detailed reason for visit: ENT - Vertigo  Preference of office, provider, location: Milon Aloe, MD [facility].) [address] [place], [place] [address] [phone]    If referral order, have you been seen by this specialty before? No, was referred to [facility] but patient was unable to see them. Patient had bad experience with staff.  Can we respond through MyChart? Yes

## 2023-08-09 NOTE — Telephone Encounter (Signed)
 Copied from CRM [id number]. Topic: Clinical - Red Word Triage >> Aug 09, 2023  9:04 AM Rennis Case wrote: Red Word that prompted transfer to Nurse Triage: Patient requesting referral for ENT, referral request sent. Request is due to her vertigo worsening.   Pt declined to speak to NT but requested to r/s physical.

## 2023-08-16 ENCOUNTER — Encounter (HOSPITAL_COMMUNITY): Payer: Self-pay

## 2023-08-23 ENCOUNTER — Inpatient Hospital Stay (HOSPITAL_COMMUNITY): Admission: RE | Admit: 2023-08-23 | Source: Ambulatory Visit

## 2023-08-23 DIAGNOSIS — Z1231 Encounter for screening mammogram for malignant neoplasm of breast: Secondary | ICD-10-CM

## 2023-08-31 ENCOUNTER — Other Ambulatory Visit (HOSPITAL_COMMUNITY): Payer: Self-pay | Admitting: Internal Medicine

## 2023-08-31 DIAGNOSIS — Z1231 Encounter for screening mammogram for malignant neoplasm of breast: Secondary | ICD-10-CM

## 2023-09-05 ENCOUNTER — Encounter (INDEPENDENT_AMBULATORY_CARE_PROVIDER_SITE_OTHER): Payer: Self-pay | Admitting: Otolaryngology

## 2023-09-06 ENCOUNTER — Encounter (HOSPITAL_COMMUNITY): Payer: Self-pay

## 2023-09-06 ENCOUNTER — Ambulatory Visit (HOSPITAL_COMMUNITY)
Admission: RE | Admit: 2023-09-06 | Discharge: 2023-09-06 | Disposition: A | Discharge status: Home / Self Care | Source: Ambulatory Visit | Attending: Internal Medicine | Admitting: Internal Medicine

## 2023-09-06 ENCOUNTER — Other Ambulatory Visit: Payer: Self-pay | Admitting: Medical Genetics

## 2023-09-06 DIAGNOSIS — Z1231 Encounter for screening mammogram for malignant neoplasm of breast: Secondary | ICD-10-CM | POA: Insufficient documentation

## 2023-09-12 ENCOUNTER — Ambulatory Visit (INDEPENDENT_AMBULATORY_CARE_PROVIDER_SITE_OTHER): Admitting: Internal Medicine

## 2023-09-12 ENCOUNTER — Encounter: Payer: Self-pay | Admitting: Internal Medicine

## 2023-09-12 VITALS — BP 135/83 | HR 83 | Ht 65.0 in | Wt 272.8 lb

## 2023-09-12 DIAGNOSIS — K219 Gastro-esophageal reflux disease without esophagitis: Secondary | ICD-10-CM | POA: Diagnosis not present

## 2023-09-12 DIAGNOSIS — Z0001 Encounter for general adult medical examination with abnormal findings: Secondary | ICD-10-CM

## 2023-09-12 DIAGNOSIS — R233 Spontaneous ecchymoses: Secondary | ICD-10-CM | POA: Insufficient documentation

## 2023-09-12 DIAGNOSIS — F5104 Psychophysiologic insomnia: Secondary | ICD-10-CM | POA: Diagnosis not present

## 2023-09-12 DIAGNOSIS — E782 Mixed hyperlipidemia: Secondary | ICD-10-CM | POA: Diagnosis not present

## 2023-09-12 DIAGNOSIS — Z1211 Encounter for screening for malignant neoplasm of colon: Secondary | ICD-10-CM | POA: Insufficient documentation

## 2023-09-12 DIAGNOSIS — F419 Anxiety disorder, unspecified: Secondary | ICD-10-CM | POA: Diagnosis not present

## 2023-09-12 DIAGNOSIS — E559 Vitamin D deficiency, unspecified: Secondary | ICD-10-CM

## 2023-09-12 DIAGNOSIS — F32A Depression, unspecified: Secondary | ICD-10-CM | POA: Diagnosis not present

## 2023-09-12 DIAGNOSIS — Z114 Encounter for screening for human immunodeficiency virus [HIV]: Secondary | ICD-10-CM

## 2023-09-12 DIAGNOSIS — R7303 Prediabetes: Secondary | ICD-10-CM | POA: Diagnosis not present

## 2023-09-12 MED ORDER — QUETIAPINE FUMARATE 50 MG PO TABS: 50.0000 mg | ORAL_TABLET | Freq: Every day | ORAL | 0 refills | Status: DC

## 2023-09-12 MED ORDER — PANTOPRAZOLE SODIUM 40 MG PO TBEC: 40.0000 mg | DELAYED_RELEASE_TABLET | Freq: Every day | ORAL | 1 refills | Status: AC

## 2023-09-12 MED ORDER — FLUOXETINE HCL 40 MG PO CAPS: 40.0000 mg | ORAL_CAPSULE | Freq: Every day | ORAL | 3 refills | Status: AC

## 2023-09-12 NOTE — Assessment & Plan Note (Signed)
 Her additional concern today is bruising that has developed on each forearm in recent months.  Will update basic labs and add coagulation studies to rule out coagulopathy.

## 2023-09-12 NOTE — Assessment & Plan Note (Signed)
 Cologuard ordered today

## 2023-09-12 NOTE — Assessment & Plan Note (Signed)
 She has been purchasing omeprazole over-the-counter but states that it is no longer effective.  Pantoprazole 40 mg daily has been prescribed today.

## 2023-09-12 NOTE — Assessment & Plan Note (Signed)
 Annual physical completed today.  Previous records and labs reviewed. -Repeat labs ordered today.  Will add coagulation studies given concerns for easy bruising. -Cologuard ordered for colon cancer screening -Addressing mental health concerns as otherwise documented -We will tentatively plan for follow-up in 4 weeks

## 2023-09-12 NOTE — Assessment & Plan Note (Signed)
 Currently prescribed fluoxetine  40 mg daily.  Hydroxyzine  was previously added for as needed anxiety relief but she states that it was ineffective.  As otherwise documented, her main concern today is insomnia.  Recommend continuing fluoxetine  as currently prescribed for now.  She will follow-up in 4 weeks for reassessment.

## 2023-09-12 NOTE — Assessment & Plan Note (Signed)
 Her main concern today is insomnia.  She attributes her difficulty sleeping to racing thoughts.  Denies suicidal ideation and homicidal ideation.  She became tearful during today's encounter when discussing her recent difficulties. -Through shared decision making, Seroquel 50 mg nightly has been started for management of insomnia.  She will follow-up in 4 weeks for reassessment.  I offered to place a referral to psychiatry but she declined as she has had negative experiences with psychiatry previously.

## 2023-09-12 NOTE — Patient Instructions (Signed)
 It was a pleasure to see you today.  Thank you for giving us  the opportunity to be involved in your care.  Below is a brief recap of your visit and next steps.  We will plan to see you again in 4 weeks.  Summary Annual physical today Repeat labs ordered Add Seroquel nightly for insomnia Cologuard ordered Follow up in 4 weeks for reassessment

## 2023-09-12 NOTE — Progress Notes (Signed)
 Complete physical exam  Patient: Annette Herrera   DOB: 1965-06-06   58 y.o. Female  MRN: [id number]  Subjective:     Chief Complaint  Patient presents with   Annual Exam   Chills    Cold chills off and on since Saturday    Anxiety    Follow up     Annette Herrera is a 58 y.o. female who presents today for a complete physical exam. She reports consuming a general diet. Home exercise routine includes walking 1 hrs per day. She generally feels fairly well. She reports sleeping poorly. She does have additional problems to discuss today.   Annette Herrera has multiple concerns to discuss today.  Her chief concern is poorly controlled anxiety and depression with racing thoughts and insomnia.  She is mainly concerned about insomnia as she has difficulty sleeping at night because she has constant thoughts running through her mind.  She denies suicidal ideation and homicidal ideation.  She is tearful when discussing her recent struggles.  She would like to review medication options to help with sleep but is not interested in establishing care with psychiatry.  Her additional concern today is bruising on her forearms.  This has become more apparent recently and she has a family member who was previously diagnosed with an unspecified hematologic cancer.  She would like to update labs today to ensure that her cell counts are within normal limits.   Most recent fall risk assessment:    09/12/2023    3:08 PM  Fall Risk   Falls in the past year? 1  Number falls in past yr: 1  Injury with Fall? 1  Risk for fall due to : History of fall(s)  Follow up Falls evaluation completed;Education provided;Falls prevention discussed     Most recent depression screenings:    09/12/2023    3:08 PM 07/19/2022    3:27 PM  PHQ 2/9 Scores  PHQ - 2 Score 2 4  PHQ- 9 Score 14 9    Vision:Not within last year  and Dental: No current dental problems and Receives regular dental care  Past Medical History:  Diagnosis  Date   ADD (attention deficit disorder)    Allergy 2015   Anemia november 2022   Anxiety    Arthritis lower back and knees   1992   Cataract 1999   have not had surgery for them   Clotting disorder (HCC) 2022   emergency bleeding ulcers in [facility] for 3/4 days   Depression    no psychologist x several years   Encounter for general adult medical examination with abnormal findings 06/21/2022   Fibromyalgia    GERD (gastroesophageal reflux disease)    Headache    Hyperlipidemia 07/25/2022   IBS (irritable bowel syndrome)    Insomnia    Morbid obesity with BMI of 45.0-49.9, adult (HCC)    PONV (postoperative nausea and vomiting)    Sleep apnea not diagnosed   I am not sure but i think i have this   Ulcer 02/2021   emergency hospitalization [facility] vomiting blood. large ulcer   Vertigo    s/p radical mastoidectomy L side.   Past Surgical History:  Procedure Laterality Date   ABDOMINAL HYSTERECTOMY  2016   large overian tumor/endometriosis   ABLATION     ANKLE SURGERY     COLONOSCOPY     FRACTURE SURGERY  2006   ankle/ index finger   INNER EAR SURGERY  left index finger     ROBOTIC ASSISTED LAP VAGINAL HYSTERECTOMY Left 07/31/2014   Procedure:  ROBOTIC ASSISTED TOTAL LAPAROSCOPIC HYSTERECTOMY WITH LEFT SALPINGO OOPHORECTOMY LEFT URETEROLYSIS RIGHT SALPINGECTOMY ;  Surgeon: Alphonso Aschoff, MD;  Location: [facility];  Service: Gynecology;  Laterality: Left;   TONSILLECTOMY     TUBAL LIGATION     Social History   Tobacco Use   Smoking status: Former    Current packs/day: 0.00    Types: Cigarettes    Quit date: 04/04/2013    Years since quitting: 10.4   Smokeless tobacco: Never   Tobacco comments:    I stoppped smoking April 2021  Substance Use Topics   Alcohol use: No   Drug use: Yes    Types: Marijuana    Comment: marijuana 07/27/14   Family History  Problem Relation Age of Onset   Diabetes Mother    Anxiety disorder Mother    Arthritis Mother    COPD Mother     Depression Mother    Hypertension Mother    Diabetes Maternal Grandmother    Arthritis Maternal Grandmother    Depression Maternal Grandmother    Varicose Veins Maternal Grandmother    Depression Father    Drug abuse Father    Hearing loss Father    Heart disease Father    Hypertension Father    Alcohol abuse Paternal Grandfather    Heart disease Paternal Grandmother    Obesity Paternal Grandmother    ADD / ADHD Maternal Uncle    ADD / ADHD Daughter    Depression Daughter    Asthma Maternal Aunt    Intellectual disability Maternal Aunt    Learning disabilities Maternal Aunt    Varicose Veins Maternal Aunt    Heart disease Maternal Aunt    Allergies  Allergen Reactions   Penicillins       Patient Care Team: Alison Irvine, FNP as PCP - General (Family Medicine)   Outpatient Medications Prior to Visit  Medication Sig   [DISCONTINUED] FLUoxetine  (PROZAC ) 40 MG capsule Take 1 capsule (40 mg total) by mouth daily.   [DISCONTINUED] cyclobenzaprine  (FLEXERIL ) 10 MG tablet Take 0.5-1 tablets (5-10 mg total) by mouth 3 (three) times daily as needed.   [DISCONTINUED] naproxen  (NAPROSYN ) 500 MG tablet Take 1 tablet (500 mg total) by mouth 2 (two) times daily with a meal.   No facility-administered medications prior to visit.   Review of Systems  Constitutional:  Negative for chills and fever.  HENT:  Negative for sore throat.   Respiratory:  Negative for cough and shortness of breath.   Cardiovascular:  Negative for chest pain, palpitations and leg swelling.  Gastrointestinal:  Negative for abdominal pain, blood in stool, constipation, diarrhea, nausea and vomiting.  Genitourinary:  Negative for dysuria and hematuria.  Musculoskeletal:  Negative for myalgias.  Skin:  Negative for itching and rash.       Bruising on forearms  Neurological:  Negative for dizziness and headaches.  Psychiatric/Behavioral:  Negative for depression, substance abuse and suicidal ideas. The patient is  nervous/anxious and has insomnia.      Objective:     BP 135/83   Pulse 83   Ht 5\' 5"  (1.651 m)   Wt 272 lb 12.8 oz (123.7 kg)   LMP 07/17/2014   SpO2 95%   BMI 45.40 kg/m  BP Readings from Last 3 Encounters:  09/12/23 135/83  07/19/22 122/72  06/21/22 113/64   Physical Exam Vitals reviewed.  Constitutional:  General: She is not in acute distress.    Appearance: Normal appearance. She is obese. She is not toxic-appearing.  HENT:     Head: Normocephalic and atraumatic.     Right Ear: External ear normal.     Left Ear: External ear normal.     Nose: Nose normal. No congestion or rhinorrhea.     Mouth/Throat:     Mouth: Mucous membranes are moist.     Pharynx: Oropharynx is clear. No oropharyngeal exudate or posterior oropharyngeal erythema.  Eyes:     General: No scleral icterus.    Extraocular Movements: Extraocular movements intact.     Conjunctiva/sclera: Conjunctivae normal.     Pupils: Pupils are equal, round, and reactive to light.  Cardiovascular:     Rate and Rhythm: Normal rate and regular rhythm.     Pulses: Normal pulses.     Heart sounds: Normal heart sounds. No murmur heard.    No friction rub. No gallop.  Pulmonary:     Effort: Pulmonary effort is normal.     Breath sounds: Normal breath sounds. No wheezing, rhonchi or rales.  Abdominal:     General: Abdomen is flat. Bowel sounds are normal. There is no distension.     Palpations: Abdomen is soft.     Tenderness: There is no abdominal tenderness.  Musculoskeletal:        General: No swelling. Normal range of motion.     Cervical back: Normal range of motion.     Right lower leg: No edema.     Left lower leg: No edema.  Lymphadenopathy:     Cervical: No cervical adenopathy.  Skin:    General: Skin is warm and dry.     Capillary Refill: Capillary refill takes less than 2 seconds.     Coloration: Skin is not jaundiced.     Comments: Multiple patches of ecchymosis on each forearm  Neurological:      General: No focal deficit present.     Mental Status: She is alert and oriented to person, place, and time.  Psychiatric:     Comments: Tearful during today's encounter   Last CBC Lab Results  Component Value Date   WBC 7.1 06/21/2022   HGB 15.2 06/21/2022   HCT 45.7 06/21/2022   MCV 91 06/21/2022   MCH 30.2 06/21/2022   RDW 12.7 06/21/2022   PLT 315 06/21/2022   Last metabolic panel Lab Results  Component Value Date   GLUCOSE 78 06/21/2022   NA 140 06/21/2022   K 4.8 06/21/2022   CL 103 06/21/2022   CO2 24 06/21/2022   BUN 10 06/21/2022   CREATININE 0.71 06/21/2022   EGFR 100 06/21/2022   CALCIUM 9.4 06/21/2022   PROT 6.6 06/21/2022   ALBUMIN 4.3 06/21/2022   LABGLOB 2.3 06/21/2022   AGRATIO 1.9 06/21/2022   BILITOT 0.3 06/21/2022   ALKPHOS 98 06/21/2022   AST 11 06/21/2022   ALT 13 06/21/2022   ANIONGAP 6 08/01/2014   Last lipids Lab Results  Component Value Date   CHOL 208 (H) 06/21/2022   HDL 50 06/21/2022   LDLCALC 131 (H) 06/21/2022   TRIG 153 (H) 06/21/2022   CHOLHDL 4.2 06/21/2022   Last hemoglobin A1c Lab Results  Component Value Date   HGBA1C 5.5 06/21/2022   Last thyroid functions Lab Results  Component Value Date   TSH 2.340 06/21/2022   Last vitamin D  Lab Results  Component Value Date   VD25OH 12.9 (L) 06/21/2022  Last vitamin B12 and Folate Lab Results  Component Value Date   VITAMINB12 288 06/21/2022   FOLATE 3.8 06/21/2022      Assessment & Plan:    Routine Health Maintenance and Physical Exam   There is no immunization history on file for this patient.  Health Maintenance  Topic Date Due   HIV Screening  Never done   DTaP/Tdap/Td (1 - Tdap) Never done   Colonoscopy  Never done   Zoster Vaccines- Shingrix (1 of 2) Never done   COVID-19 Vaccine (1 - 2024-25 season) Never done   INFLUENZA VACCINE  11/10/2023   MAMMOGRAM  09/05/2025   Hepatitis C Screening  Completed   HPV VACCINES  Aged Out   Meningococcal B  Vaccine  Aged Out    Discussed health benefits of physical activity, and encouraged her to engage in regular exercise appropriate for her age and condition.  Problem List Items Addressed This Visit       GERD (gastroesophageal reflux disease)   She has been purchasing omeprazole over-the-counter but states that it is no longer effective.  Pantoprazole 40 mg daily has been prescribed today.      Anxiety and depression   Currently prescribed fluoxetine  40 mg daily.  Hydroxyzine  was previously added for as needed anxiety relief but she states that it was ineffective.  As otherwise documented, her main concern today is insomnia.  Recommend continuing fluoxetine  as currently prescribed for now.  She will follow-up in 4 weeks for reassessment.      Encounter for well adult exam with abnormal findings - Primary   Annual physical completed today.  Previous records and labs reviewed. -Repeat labs ordered today.  Will add coagulation studies given concerns for easy bruising. -Cologuard ordered for colon cancer screening -Addressing mental health concerns as otherwise documented -We will tentatively plan for follow-up in 4 weeks      Psychophysiologic insomnia   Her main concern today is insomnia.  She attributes her difficulty sleeping to racing thoughts.  Denies suicidal ideation and homicidal ideation.  She became tearful during today's encounter when discussing her recent difficulties. -Through shared decision making, Seroquel 50 mg nightly has been started for management of insomnia.  She will follow-up in 4 weeks for reassessment.  I offered to place a referral to psychiatry but she declined as she has had negative experiences with psychiatry previously.      Easy bruising   Her additional concern today is bruising that has developed on each forearm in recent months.  Will update basic labs and add coagulation studies to rule out coagulopathy.      Colon cancer screening   Cologuard  ordered today      Return in about 4 weeks (around 10/10/2023) for Insomnia, anxiety / depression.  Tobi Fortes, MD

## 2023-09-13 ENCOUNTER — Ambulatory Visit: Payer: Self-pay | Admitting: Internal Medicine

## 2023-09-13 LAB — CBC WITH DIFFERENTIAL/PLATELET
Basophils Absolute: 0.1 10*3/uL (ref 0.0–0.2)
Basos: 1 %
EOS (ABSOLUTE): 0.2 10*3/uL (ref 0.0–0.4)
Eos: 2 %
Hematocrit: 45.2 % (ref 34.0–46.6)
Hemoglobin: 15.3 g/dL (ref 11.1–15.9)
Immature Grans (Abs): 0 10*3/uL (ref 0.0–0.1)
Immature Granulocytes: 0 %
Lymphocytes Absolute: 3.3 10*3/uL — ABNORMAL HIGH (ref 0.7–3.1)
Lymphs: 42 %
MCH: 30.8 pg (ref 26.6–33.0)
MCHC: 33.8 g/dL (ref 31.5–35.7)
MCV: 91 fL (ref 79–97)
Monocytes Absolute: 0.6 10*3/uL (ref 0.1–0.9)
Monocytes: 8 %
Neutrophils Absolute: 3.7 10*3/uL (ref 1.4–7.0)
Neutrophils: 47 %
Platelets: 339 10*3/uL (ref 150–450)
RBC: 4.97 x10E6/uL (ref 3.77–5.28)
RDW: 13 % (ref 11.7–15.4)
WBC: 8 10*3/uL (ref 3.4–10.8)

## 2023-09-13 LAB — VITAMIN D 25 HYDROXY (VIT D DEFICIENCY, FRACTURES): Vit D, 25-Hydroxy: 26.3 ng/mL — ABNORMAL LOW (ref 30.0–100.0)

## 2023-09-13 LAB — CMP14+EGFR
ALT: 14 IU/L (ref 0–32)
AST: 14 IU/L (ref 0–40)
Albumin: 4.2 g/dL (ref 3.8–4.9)
Alkaline Phosphatase: 116 IU/L (ref 44–121)
BUN/Creatinine Ratio: 9 (ref 9–23)
BUN: 7 mg/dL (ref 6–24)
Bilirubin Total: 0.3 mg/dL (ref 0.0–1.2)
CO2: 23 mmol/L (ref 20–29)
Calcium: 9.5 mg/dL (ref 8.7–10.2)
Chloride: 103 mmol/L (ref 96–106)
Creatinine, Ser: 0.74 mg/dL (ref 0.57–1.00)
Globulin, Total: 2.8 g/dL (ref 1.5–4.5)
Glucose: 81 mg/dL (ref 70–99)
Potassium: 4.9 mmol/L (ref 3.5–5.2)
Sodium: 139 mmol/L (ref 134–144)
Total Protein: 7 g/dL (ref 6.0–8.5)
eGFR: 95 mL/min/{1.73_m2} (ref >59)

## 2023-09-13 LAB — LIPID PANEL
Chol/HDL Ratio: 4.3 ratio (ref 0.0–4.4)
Cholesterol, Total: 256 mg/dL — ABNORMAL HIGH (ref 100–199)
HDL: 59 mg/dL (ref >39)
LDL Chol Calc (NIH): 170 mg/dL — ABNORMAL HIGH (ref 0–99)
Triglycerides: 150 mg/dL — ABNORMAL HIGH (ref 0–149)
VLDL Cholesterol Cal: 27 mg/dL (ref 5–40)

## 2023-09-13 LAB — TSH+FREE T4
Free T4: 0.84 ng/dL (ref 0.82–1.77)
TSH: 4.13 u[IU]/mL (ref 0.450–4.500)

## 2023-09-13 LAB — HIV ANTIBODY (ROUTINE TESTING W REFLEX): HIV Screen 4th Generation wRfx: NONREACTIVE

## 2023-09-13 LAB — B12 AND FOLATE PANEL
Folate: 5.5 ng/mL (ref >3.0)
Vitamin B-12: 432 pg/mL (ref 232–1245)

## 2023-09-13 LAB — HEMOGLOBIN A1C
Est. average glucose Bld gHb Est-mCnc: 114 mg/dL
Hgb A1c MFr Bld: 5.6 % (ref 4.8–5.6)

## 2023-09-13 LAB — APTT: aPTT: 26 s (ref 24–33)

## 2023-09-13 LAB — PROTIME-INR
INR: 0.9 (ref 0.9–1.2)
Prothrombin Time: 10.3 s (ref 9.1–12.0)

## 2023-10-20 ENCOUNTER — Ambulatory Visit

## 2023-10-20 VITALS — BP 122/62 | HR 87 | Ht 65.0 in | Wt 282.0 lb

## 2023-10-20 DIAGNOSIS — M797 Fibromyalgia: Secondary | ICD-10-CM | POA: Diagnosis not present

## 2023-10-20 DIAGNOSIS — F32A Depression, unspecified: Secondary | ICD-10-CM

## 2023-10-20 DIAGNOSIS — F419 Anxiety disorder, unspecified: Secondary | ICD-10-CM | POA: Diagnosis not present

## 2023-10-20 MED ORDER — PREGABALIN 75 MG PO CAPS: 75.0000 mg | ORAL_CAPSULE | Freq: Two times a day (BID) | ORAL | 2 refills | Status: AC

## 2023-10-20 NOTE — Progress Notes (Unsigned)
 Established Patient Office Visit  Subjective   Patient ID: KRYSTN DERMODY, female    DOB: 04/08/1966  Age: 58 y.o. MRN: [id number]  Chief Complaint  Patient presents with   Medical Management of Chronic Issues    Pt here for follow up states Seroquel  is not working for her     HPI  Patient Active Problem List   Diagnosis Date Noted   Fibromyalgia 10/24/2023   Encounter for well adult exam with abnormal findings 09/12/2023   Psychophysiologic insomnia 09/12/2023   Easy bruising 09/12/2023   GERD (gastroesophageal reflux disease) 09/12/2023   Colon cancer screening 09/12/2023   Vitamin D  deficiency 07/25/2022   Hyperlipidemia 07/25/2022   Anxiety and depression 06/21/2022   Prediabetes 06/21/2022   Encounter for general adult medical examination with abnormal findings 06/21/2022   Vertigo 06/21/2022   Peptic ulcer 03/01/2021   Morbid obesity (HCC) 07/31/2014   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  Constitutional:      Appearance: Normal appearance.  HENT:     Head: Normocephalic.  Eyes:     Extraocular Movements: Extraocular movements intact.     Pupils: Pupils are equal, round, and reactive to light.  Cardiovascular:     Rate and Rhythm: Normal rate and regular rhythm.  Pulmonary:     Effort: Pulmonary effort is normal.     Breath sounds: Normal breath sounds.   Musculoskeletal:     Cervical back: Normal range of motion and neck supple.  Neurological:     Mental Status: She is alert and oriented to person, place, and time.  Psychiatric:        Attention and Perception: Attention normal.        Mood and Affect: Mood is anxious.        Speech: Speech is rapid and pressured.        Behavior: Behavior normal.        Thought Content: Thought content normal.        Cognition and Memory: Cognition normal.     No results found for any visits on 10/20/23.  Last CBC Lab Results  Component Value Date   WBC 8.0 09/12/2023   HGB 15.3 09/12/2023   HCT 45.2 09/12/2023   MCV 91 09/12/2023   MCH 30.8 09/12/2023   RDW 13.0 09/12/2023   PLT 339 09/12/2023   Last metabolic panel Lab Results  Component Value Date   GLUCOSE 81 09/12/2023   NA 139 09/12/2023   K 4.9 09/12/2023   CL 103 09/12/2023   CO2 23 09/12/2023   BUN 7 09/12/2023   CREATININE 0.74 09/12/2023   EGFR 95 09/12/2023   CALCIUM 9.5 09/12/2023   PROT 7.0 09/12/2023   ALBUMIN 4.2 09/12/2023  LABGLOB 2.8 09/12/2023   AGRATIO 1.9 06/21/2022   BILITOT 0.3 09/12/2023   ALKPHOS 116 09/12/2023   AST 14 09/12/2023   ALT 14 09/12/2023   ANIONGAP 6 08/01/2014   Last lipids Lab Results  Component Value Date   CHOL 256 (H) 09/12/2023   HDL 59 09/12/2023   LDLCALC 170 (H) 09/12/2023   TRIG 150 (H) 09/12/2023   CHOLHDL 4.3 09/12/2023   Last hemoglobin A1c Lab Results  Component Value Date   HGBA1C 5.6 09/12/2023   Last vitamin D  Lab Results  Component Value Date   VD25OH 26.3 (L) 09/12/2023      The 10-year ASCVD risk score (Arnett DK, et al., 2019) is: 2.4%    Assessment & Plan:   Problem List Items Addressed This Visit       Other   Anxiety and depression - Primary   Currently prescribed fluoxetine  40 mg daily.  Hydroxyzine  was previously added for as needed anxiety relief but she states that it was ineffective and caused drowsiness.   We will add Lyrica  for  her anxiety and fibromyalgia.  Advised to watch for increase in anxiety and/or depression.  Recommend follow-up in 6 to 8 weeks or sooner if needed      Relevant Medications   pregabalin  (LYRICA ) 75 MG capsule   Fibromyalgia   We will add Lyrica  for her anxiety and fibromyalgia.  Advised to watch for increase in anxiety and/or depression.  Recommend follow-up in 6 to 8 weeks or sooner if needed      Relevant Medications   pregabalin  (LYRICA ) 75 MG capsule    No follow-ups on file.    Leita Longs, FNP

## 2023-10-24 DIAGNOSIS — M797 Fibromyalgia: Secondary | ICD-10-CM | POA: Insufficient documentation

## 2023-10-24 NOTE — Assessment & Plan Note (Signed)
 Currently prescribed fluoxetine  40 mg daily.  Hydroxyzine  was previously added for as needed anxiety relief but she states that it was ineffective and caused drowsiness.   We will add Lyrica  for her anxiety and fibromyalgia.  Advised to watch for increase in anxiety and/or depression.  Recommend follow-up in 6 to 8 weeks or sooner if needed

## 2023-10-24 NOTE — Assessment & Plan Note (Signed)
 We will add Lyrica  for her anxiety and fibromyalgia.  Advised to watch for increase in anxiety and/or depression.  Recommend follow-up in 6 to 8 weeks or sooner if needed

## 2023-11-30 ENCOUNTER — Ambulatory Visit (INDEPENDENT_AMBULATORY_CARE_PROVIDER_SITE_OTHER): Admitting: Otolaryngology

## 2023-11-30 ENCOUNTER — Encounter (INDEPENDENT_AMBULATORY_CARE_PROVIDER_SITE_OTHER): Payer: Self-pay | Admitting: Otolaryngology

## 2023-11-30 VITALS — BP 125/85 | HR 65 | Ht 65.0 in | Wt 269.0 lb

## 2023-11-30 DIAGNOSIS — R42 Dizziness and giddiness: Secondary | ICD-10-CM

## 2023-11-30 DIAGNOSIS — H9190 Unspecified hearing loss, unspecified ear: Secondary | ICD-10-CM

## 2023-11-30 DIAGNOSIS — H8110 Benign paroxysmal vertigo, unspecified ear: Secondary | ICD-10-CM

## 2023-11-30 DIAGNOSIS — Z9089 Acquired absence of other organs: Secondary | ICD-10-CM

## 2023-11-30 NOTE — Progress Notes (Signed)
 Dear Dr. Melvenia, Here is my assessment for our mutual patient, Annette Herrera. Thank you for allowing me the opportunity to care for your patient. Please do not hesitate to contact me should you have any other questions. Sincerely, Dr. Eldora Blanch  Otolaryngology Clinic Note Referring provider: Dr. Melvenia HPI:  Annette Herrera is a 58 y.o. female kindly referred by Dr. Melvenia for evaluation of vertigo.  Initial visit (11/2023): Patient reports: here for vertigo - for the last 10 years ago, she reports that it is BPPV for sure. She reports that it is significantly worse. Vertigo occurs when she lays a certain way, and it lasts a few seconds mostly. Every time she moves her head to the side, she will have vertigo. This has happened to her before, and she has been treated wit the Epley maneuver and it had helped, but now she has tried it and has not helped. Happens every day. She also reports frequent instability. She also has anxiety and reports that makes it worse. No sound or pressure induced vertigo. Denies Meniere's sx.   Of note, she has a history of radical mastoidectomy on left for 1993 (Dr. Thaddeus or [facility]) for cholesteatoma. Has not had cavity cleaned in years. She feels like her hearing is worse on left, unsure if it has declined. Significant non-pulsatile tinnitus and has to use white noise to sleep. No hearing aids.   Patient denies: ear pain, fullness, drainage Patient also denies barotrauma, vestibular suppressant use, ototoxic medication use Prior ear surgery: no  H&N Surgery: Left radical mastoidectomy (1993) Personal or FHx of bleeding dz or anesthesia difficulty: no  AP/AC: no  Tobacco: former, quit  PMHx: PUD, PreDM, MDD, Fibromyalgia, Ovarian cyst  RADIOGRAPHIC EVALUATION AND INDEPENDENT REVIEW OF OTHER RECORDS:: Dr. Melvenia (06/21/2022): noted vertigo and h/o radical mastoidectomy on left; Dx: vertigo, Rx: ref to ENT CBC and CMP 09/12/2023: BUN/Cr 7/0.74, WBC 8.0, Eos  200  PMH/Meds/All/SocHx/FamHx/ROS:   Past Medical History:  Diagnosis Date   ADD (attention deficit disorder)    Allergy 2015   Anemia november 2022   Anxiety    Arthritis lower back and knees   1992   Cataract 1999   have not had surgery for them   Clotting disorder (HCC) 2022   emergency bleeding ulcers in [facility] for 3/4 days   Depression    no psychologist x several years   Encounter for general adult medical examination with abnormal findings 06/21/2022   Fibromyalgia    GERD (gastroesophageal reflux disease)    Headache    Hyperlipidemia 07/25/2022   IBS (irritable bowel syndrome)    Insomnia    Morbid obesity with BMI of 45.0-49.9, adult (HCC)    PONV (postoperative nausea and vomiting)    Sleep apnea not diagnosed   I am not sure but i think i have this   Ulcer 02/2021   emergency hospitalization [facility] vomiting blood. large ulcer   Vertigo    s/p radical mastoidectomy L side.     Past Surgical History:  Procedure Laterality Date   ABDOMINAL HYSTERECTOMY  2016   large overian tumor/endometriosis   ABLATION     ANKLE SURGERY     COLONOSCOPY     FRACTURE SURGERY  2006   ankle/ index finger   INNER EAR SURGERY     left index finger     ROBOTIC ASSISTED LAP VAGINAL HYSTERECTOMY Left 07/31/2014   Procedure:  ROBOTIC ASSISTED TOTAL LAPAROSCOPIC HYSTERECTOMY WITH LEFT SALPINGO OOPHORECTOMY LEFT URETEROLYSIS RIGHT  SALPINGECTOMY ;  Surgeon: Maurilio Ship, MD;  Location: [facility];  Service: Gynecology;  Laterality: Left;   TONSILLECTOMY     TUBAL LIGATION      Family History  Problem Relation Age of Onset   Diabetes Mother    Anxiety disorder Mother    Arthritis Mother    COPD Mother    Depression Mother    Hypertension Mother    Diabetes Maternal Grandmother    Arthritis Maternal Grandmother    Depression Maternal Grandmother    Varicose Veins Maternal Grandmother    Depression Father    Drug abuse Father    Hearing loss Father    Heart disease Father     Hypertension Father    Alcohol abuse Paternal Grandfather    Heart disease Paternal Grandmother    Obesity Paternal Grandmother    ADD / ADHD Maternal Uncle    ADD / ADHD Daughter    Depression Daughter    Asthma Maternal Aunt    Intellectual disability Maternal Aunt    Learning disabilities Maternal Aunt    Varicose Veins Maternal Aunt    Heart disease Maternal Aunt      Social Connections: Unknown (10/20/2023)   Social Connection and Isolation Panel    Frequency of Communication with Friends and Family: Twice a week    Frequency of Social Gatherings with Friends and Family: Patient declined    Attends Religious Services: Never    Database administrator or Organizations: No    Attends Engineer, structural: Not on file    Marital Status: Divorced  Recent Concern: Social Connections - Socially Isolated (09/11/2023)   Social Connection and Isolation Panel    Frequency of Communication with Friends and Family: Twice a week    Frequency of Social Gatherings with Friends and Family: Once a week    Attends Religious Services: Never    Database administrator or Organizations: No    Attends Engineer, structural: Not on file    Marital Status: Divorced      Current Outpatient Medications:    FLUoxetine  (PROZAC ) 40 MG capsule, Take 1 capsule (40 mg total) by mouth daily., Disp: 90 capsule, Rfl: 3   pantoprazole  (PROTONIX ) 40 MG tablet, Take 1 tablet (40 mg total) by mouth daily., Disp: 90 tablet, Rfl: 1   pregabalin  (LYRICA ) 75 MG capsule, Take 1 capsule (75 mg total) by mouth 2 (two) times daily., Disp: 60 capsule, Rfl: 2   Physical Exam:   BP 125/85 (BP Location: Right Arm, Patient Position: Sitting, Cuff Size: Large)   Pulse 65   Ht 5' 5 (1.651 m)   Wt 269 lb (122 kg)   LMP 07/17/2014   SpO2 95%   BMI 44.76 kg/m   Salient findings:  CN II-XII intact Given history and complaints, ear microscopy was indicated and performed for evaluation with findings as below  in physical exam section and in procedures; Left ear with postauricular scar well healed, clear evidence of meatoplasty, well healed; fair amount of mastoid bowl debris which was cleared; mastoid cavity otherwise clean, no granulation; most of TM appears intact with small superior perforation from which debris was cleaned; unable to see round or oval window; Right EAC clear and TM intact with well pneumatized middle ear spaces Weber 512: left Anterior rhinoscopy: Septum intact; bilateral inferior turbinates without significant hypertrophy No lesions of oral cavity/oropharynx No obviously palpable neck masses/lymphadenopathy/thyromegaly No respiratory distress or stridor DH neg b/l  Seprately  Identifiable Procedures:  Prior to initiating any procedures, risks/benefits/alternatives were explained to the patient and verbal consent obtained. Procedure: Bilateral ear microscopy and mastoid cavity debridement using microscope on left (CPT [phone]) - Mod 25 Pre-procedure diagnosis: Left canal wall down mastoidectomy with mastoid cavity debris impaction Post-procedure diagnosis: same Indication: Left post-surgical canal wall down mastoid cavity - this is a surgical cavity requiring debridement regularly to prevent infection and debris impaction; as such, debridement using microscopy was indicated; given patient's otologic complaints and history as well as for improved and comprehensive examination of ear, bilateral otologic examination using microscope was performed and mastoid cavity debrided  Procedure: Patient was placed semi-recumbent. Left mastoid cavity was examined using the microscope with findings above. Debris was removed and examination performed after removal using alligator and pick with improvement in EAC examination and patency. Findings as above Patient tolerated the procedure well. See findings above   Impression & Plans:  Annette Herrera is a 58 y.o. female with:  1. Vertigo   2. BPPV  (benign paroxysmal positional vertigo), unspecified laterality   3. History of mastoidectomy   4. Subjective hearing loss    Based on Hx, BPPV seems most likely and we discussed options - home exercises v/s PT vestibular rehab; she opted for latter H/o left CWD mastoidectomy - will require regular cleaning; likely some CHL as well, no recent audio so will obtain; d/w pt re: water precautions  - f/u 3 months with audiogram  See below regarding exact medications prescribed this encounter including dosages and route: No orders of the defined types were placed in this encounter.     Thank you for allowing me the opportunity to care for your patient. Please do not hesitate to contact me should you have any other questions.  Sincerely, Eldora Blanch, MD Otolaryngologist (ENT), [facility] Phone: [phone] Fax: [phone]  12/18/2023, 12:05 PM   MDM:  Level 4 - 99204 Complexity/Problems addressed: mod -  multiple chronic problems Data complexity: mod - independent review of notes, labs, ordering test - Morbidity: low currently  - Prescription Drug prescribed or managed: no

## 2024-01-18 ENCOUNTER — Ambulatory Visit

## 2024-01-19 NOTE — Progress Notes (Signed)
 Annette Herrera                                          MRN: [id number]   01/19/2024   The [facility] Quality Team Specialist reviewed this patient medical record for the purposes of chart review for care gap closure. The following were reviewed: chart review for care gap closure-colorectal cancer screening.    [facility] Quality Team

## 2024-02-09 ENCOUNTER — Other Ambulatory Visit: Payer: Self-pay | Admitting: Medical Genetics

## 2024-02-09 DIAGNOSIS — Z006 Encounter for examination for normal comparison and control in clinical research program: Secondary | ICD-10-CM

## 2024-03-04 ENCOUNTER — Ambulatory Visit (INDEPENDENT_AMBULATORY_CARE_PROVIDER_SITE_OTHER): Admitting: Otolaryngology

## 2024-03-04 ENCOUNTER — Ambulatory Visit (INDEPENDENT_AMBULATORY_CARE_PROVIDER_SITE_OTHER): Admitting: Audiology

## 2024-03-13 ENCOUNTER — Encounter (INDEPENDENT_AMBULATORY_CARE_PROVIDER_SITE_OTHER): Payer: Self-pay

## 2024-03-20 ENCOUNTER — Encounter (INDEPENDENT_AMBULATORY_CARE_PROVIDER_SITE_OTHER): Payer: Self-pay

## 2024-03-26 ENCOUNTER — Telehealth: Admitting: Family Medicine

## 2024-03-26 DIAGNOSIS — R3989 Other symptoms and signs involving the genitourinary system: Secondary | ICD-10-CM | POA: Diagnosis not present

## 2024-03-26 MED ORDER — CEPHALEXIN 500 MG PO CAPS: 500.0000 mg | ORAL_CAPSULE | Freq: Two times a day (BID) | ORAL | 0 refills | Status: AC

## 2024-03-26 NOTE — Progress Notes (Signed)

## 2024-04-02 ENCOUNTER — Other Ambulatory Visit: Payer: Self-pay

## 2024-05-06 ENCOUNTER — Ambulatory Visit

## 2024-05-19 IMAGING — US US BREAST*R* LIMITED INC AXILLA
1 series · 11 of 11 positions shown · non-contrast
Comparison: Previous exam(s).

CLINICAL DATA: 55-year-old female with a palpable area of in the
right breast she has been feeling for at least the past 5 years.

EXAM:
DIGITAL DIAGNOSTIC UNILATERAL RIGHT MAMMOGRAM WITH TOMOSYNTHESIS AND
CAD; ULTRASOUND RIGHT BREAST LIMITED
TECHNIQUE: Right digital diagnostic mammography and breast tomosynthesis was
performed. The images were evaluated with computer-aided detection.;
Targeted ultrasound examination of the right breast was performed

[Series 1: us breast ltd uni right inc axilla · 11 of 11 slices shown]
[im 1/11]
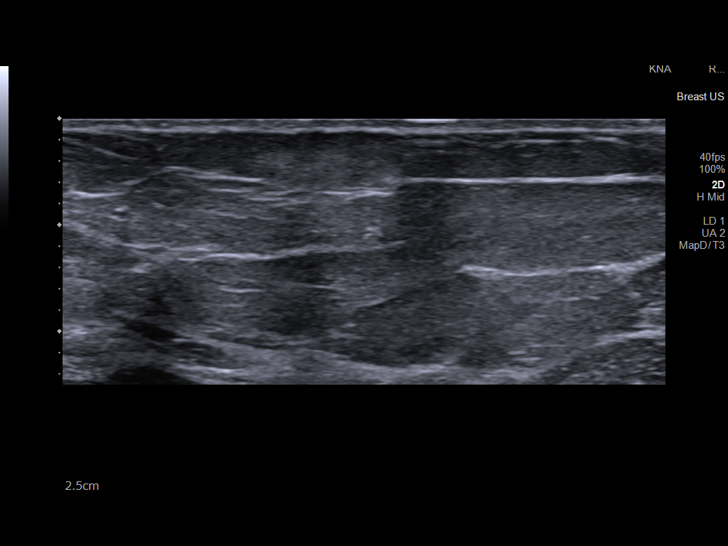
[im 2/11]
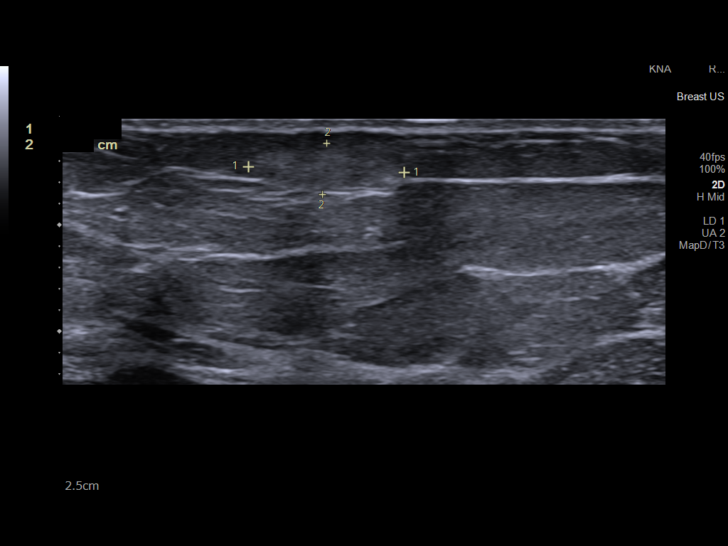
[im 3/11]
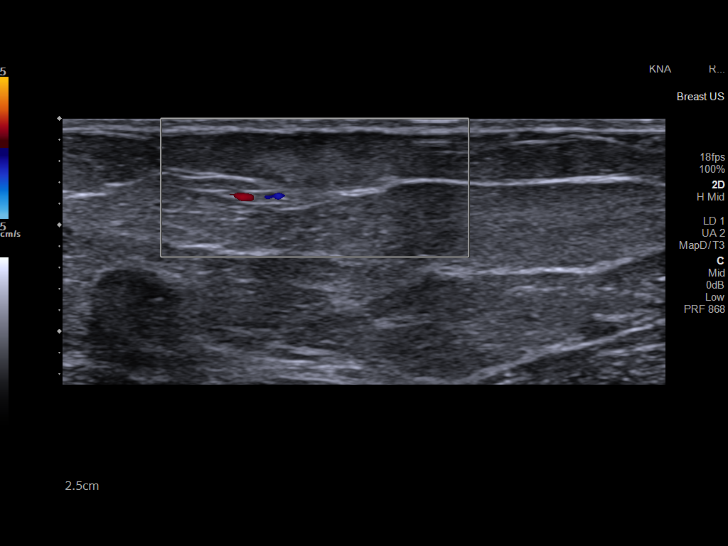
[im 4/11]
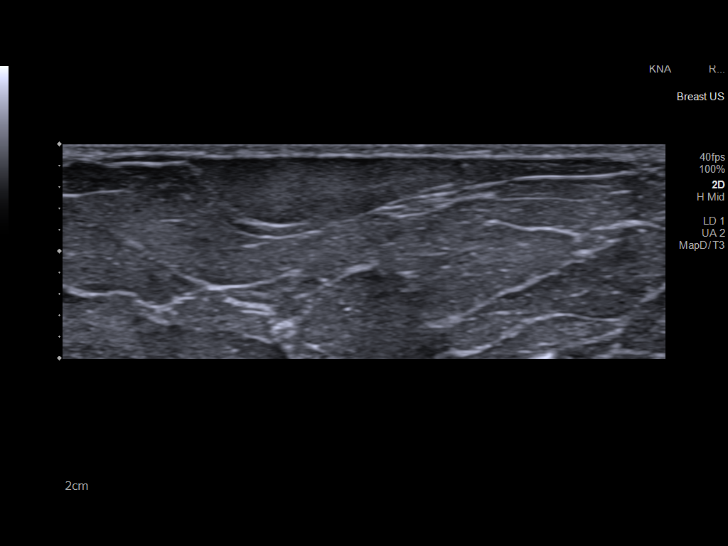
[im 5/11]
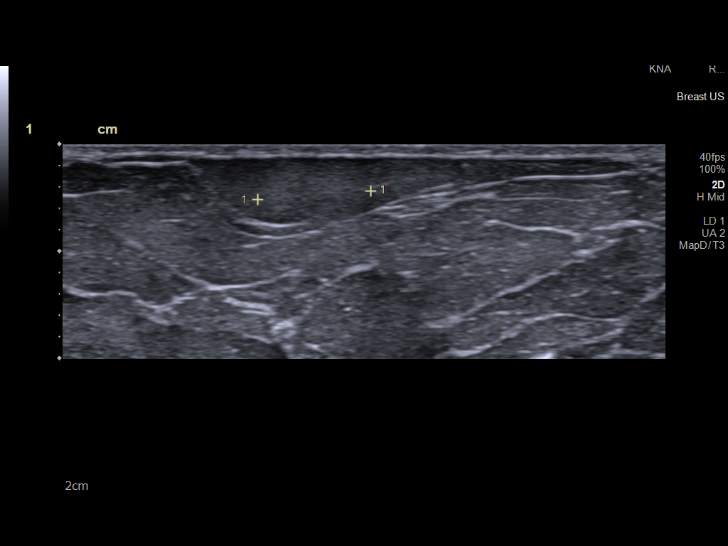
[im 6/11]
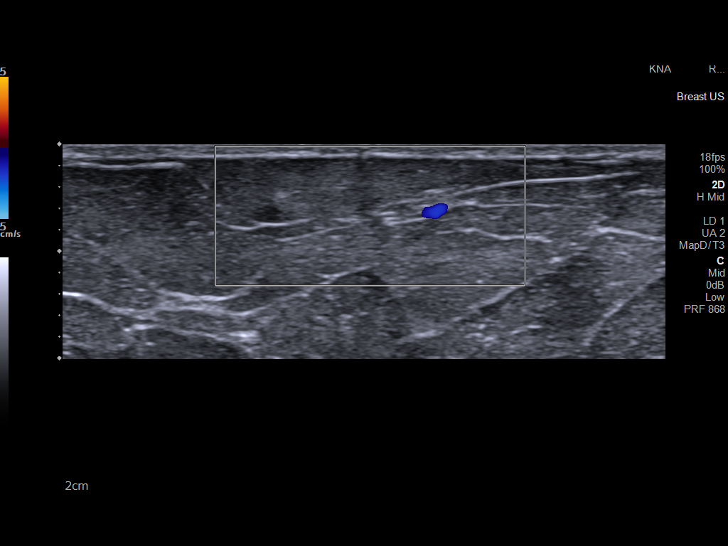
[im 7/11]
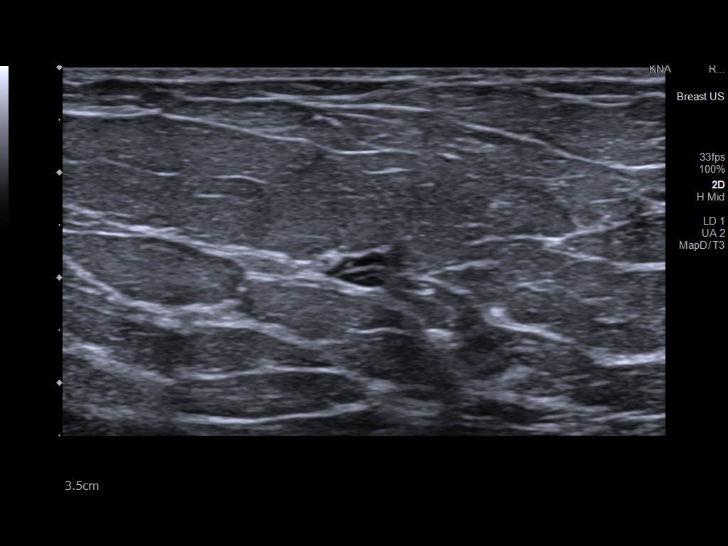
[im 8/11]
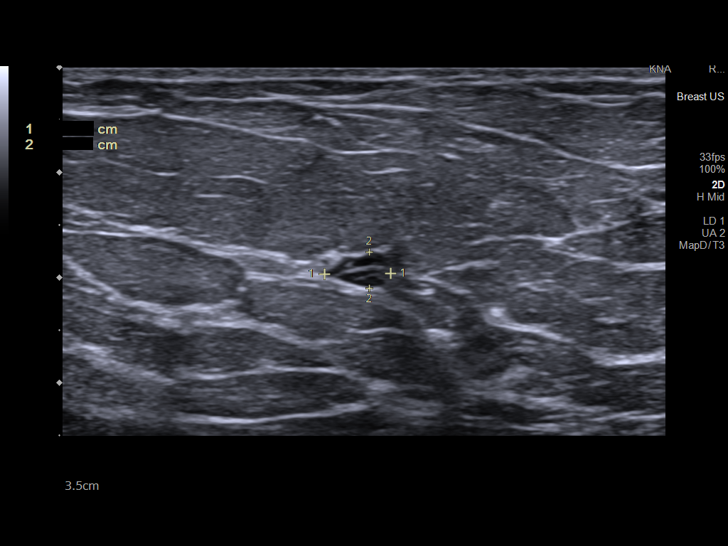
[im 9/11]
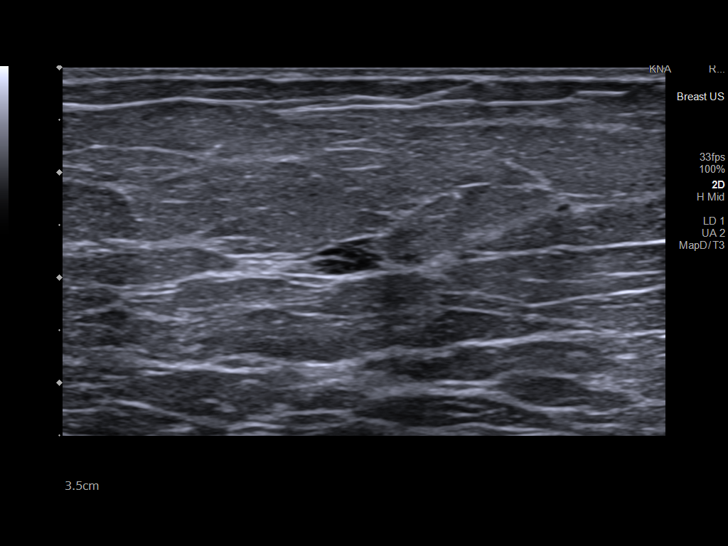
[im 10/11]
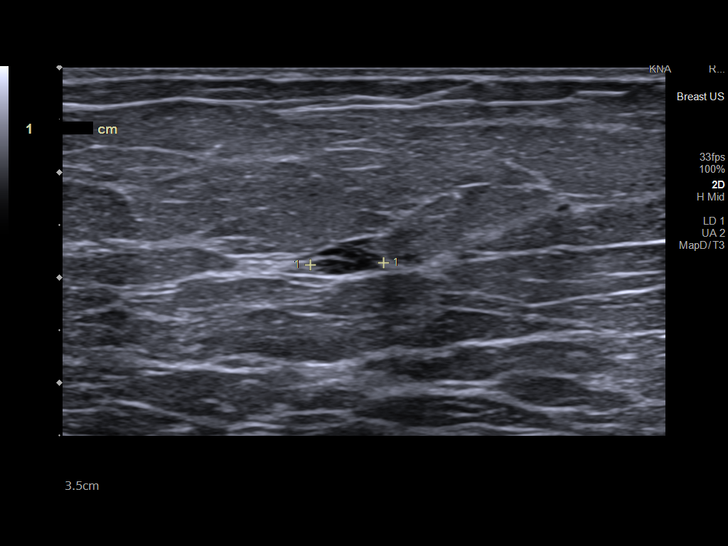
[im 11/11]
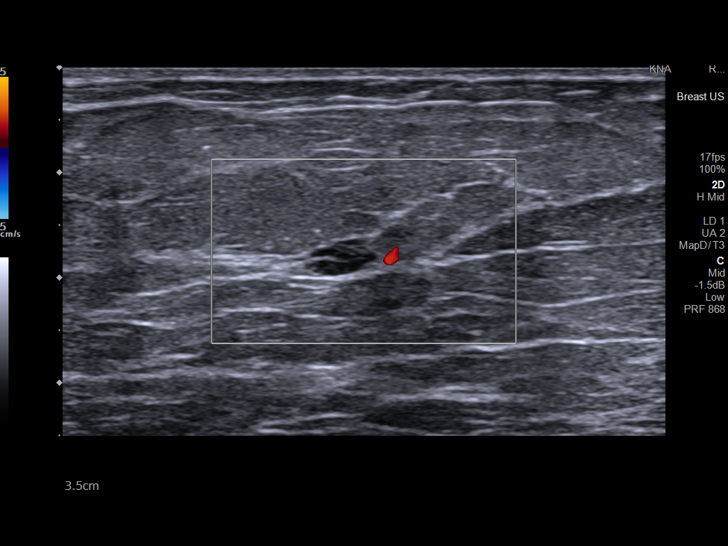

[11 of 11 positions shown; findings below may reference images not displayed]

ACR Breast Density Category b: There are scattered areas of
fibroglandular density.
FINDINGS: Oval circumscribed mass in the outer right breast measures 0.7 cm.
Spot compression tomograms were performed over the palpable area of
concern upper inner right breast with no definite abnormality seen.

Physical examination at site of palpable concern reveals a soft
mobile area of thickening at the approximate 1 o'clock position.

Targeted ultrasound of the right breast was performed. There is an
oval circumscribed isoechoic mass in the right breast at 1 o'clock 8
cm from nipple compatible with a lipoma. This measures 1.5 x 0.5 by
2.3 cm and is similar in appearance when compared to prior
ultrasound from 9244 when remeasured in a similar fashion. Targeted
ultrasound of the outer right breast was performed demonstrating a
cluster of cysts at 9 o'clock 8 cm from nipple measuring 0.6 x 0.3 x
0.7 cm. This corresponds well with the mass seen in the outer right
breast at mammography.
IMPRESSION: Benign-appearing lipoma at site of palpable concern in the upper
slightly inner right breast. No findings of malignancy in the right
breast.

RECOMMENDATION:
Recommend annual screening mammography, due January 2022.

I have discussed the findings and recommendations with the patient.
If applicable, a reminder letter will be sent to the patient
regarding the next appointment.

BI-RADS CATEGORY  2: Benign.

## 2024-05-19 IMAGING — MG MM DIGITAL DIAGNOSTIC UNILAT*R* W/ TOMO W/ CAD
6 of 10 series · 6 of 30 positions shown · non-contrast
Comparison: Previous exam(s).

CLINICAL DATA: 55-year-old female with a palpable area of in the
right breast she has been feeling for at least the past 5 years.

EXAM:
DIGITAL DIAGNOSTIC UNILATERAL RIGHT MAMMOGRAM WITH TOMOSYNTHESIS AND
CAD; ULTRASOUND RIGHT BREAST LIMITED
TECHNIQUE: Right digital diagnostic mammography and breast tomosynthesis was
performed. The images were evaluated with computer-aided detection.;
Targeted ultrasound examination of the right breast was performed

[R CV synth-2D]
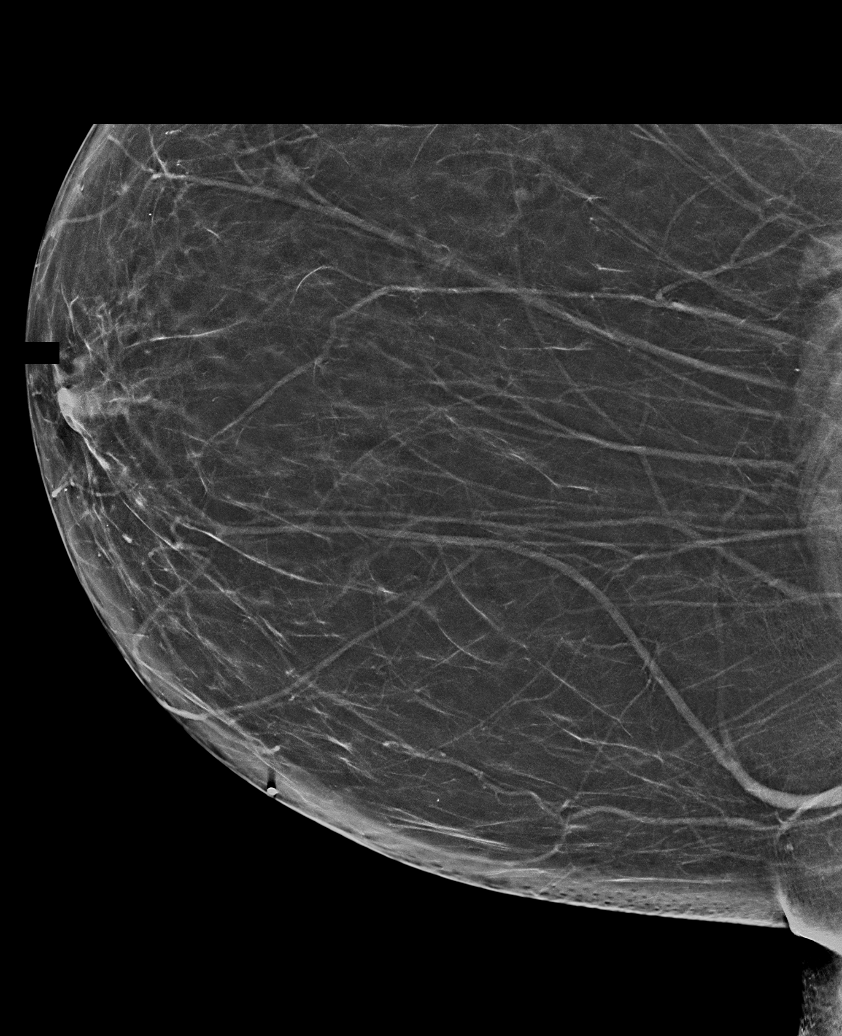

[R MLO synth-2D (1 of 2)]
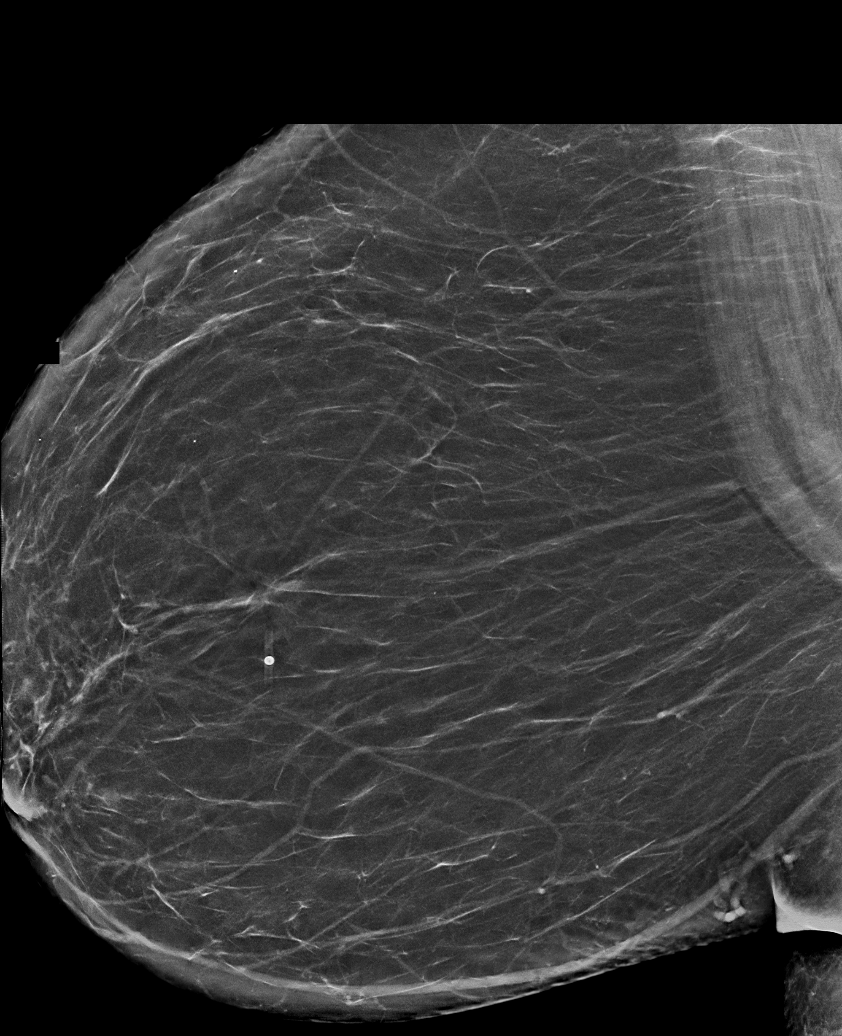

[R MLO synth-2D (2 of 2)]
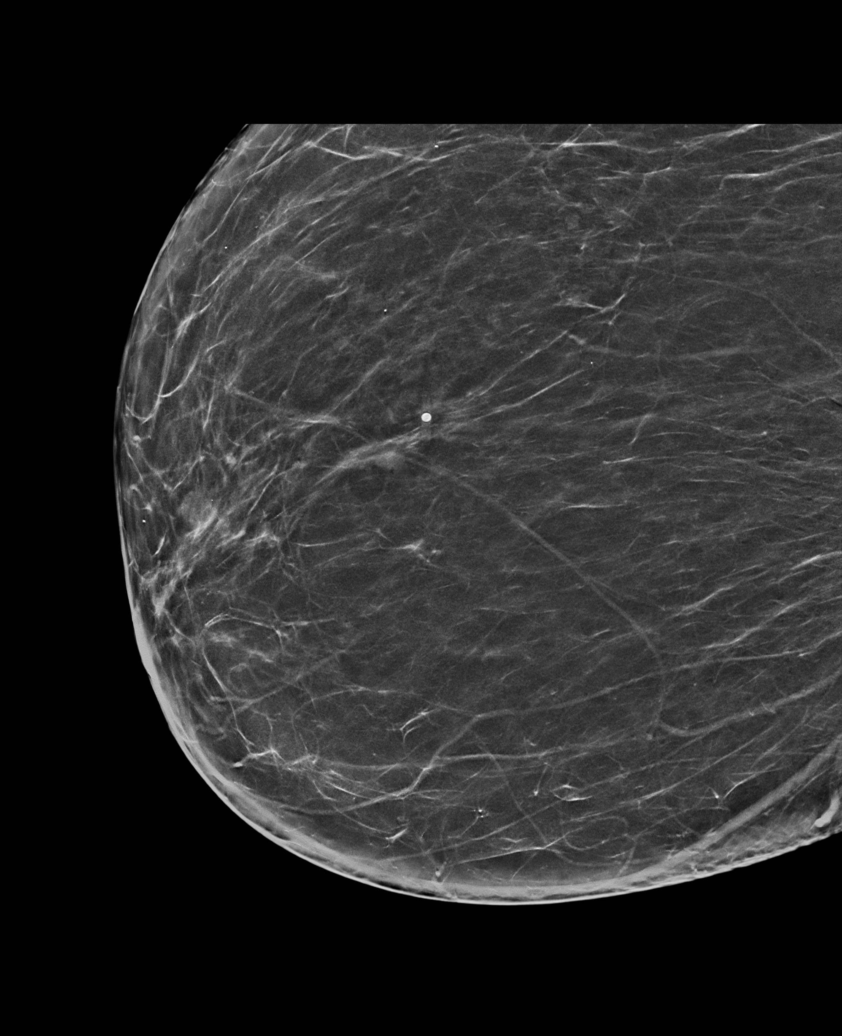

[R CC synth-2D (1 of 2)]
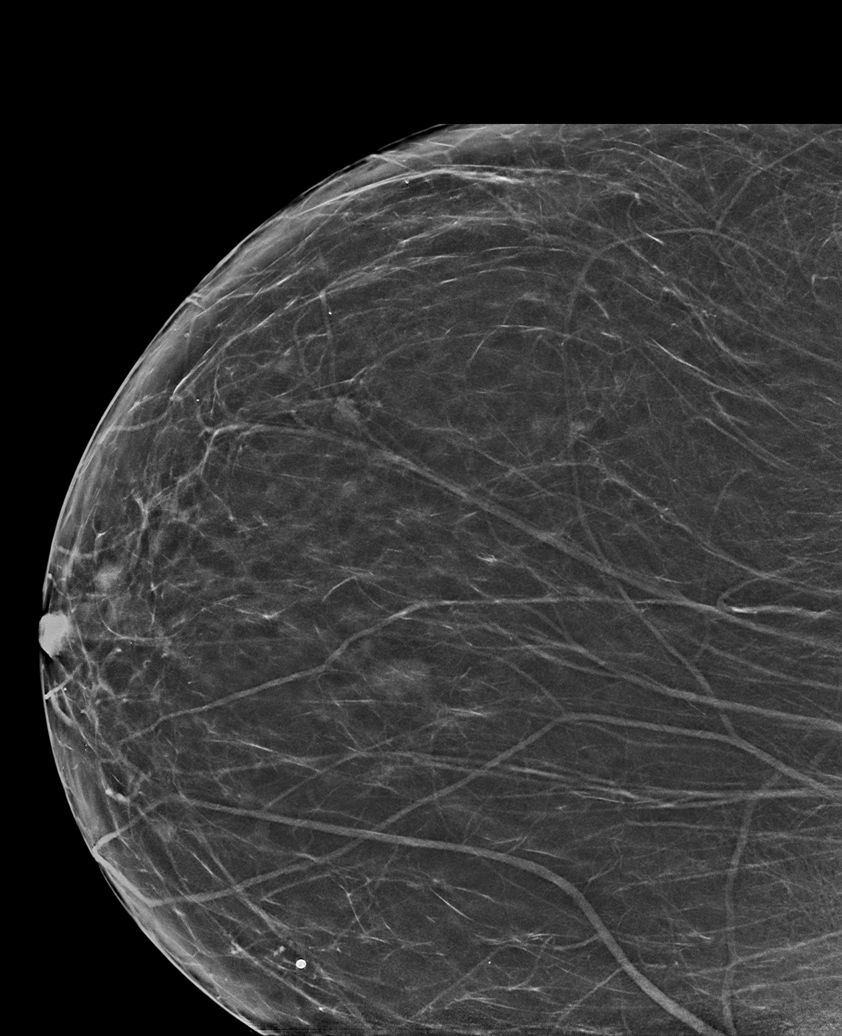

[R CC synth-2D (2 of 2)]
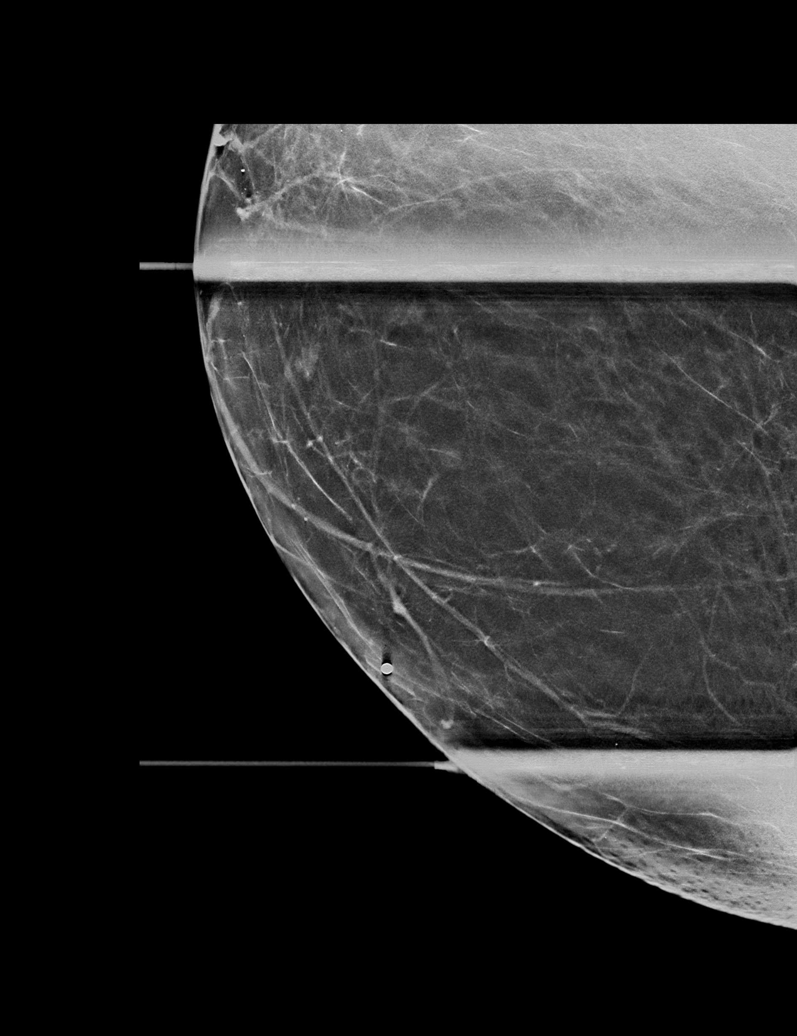

[R MLO tomo · tomo slice 41/81.0]
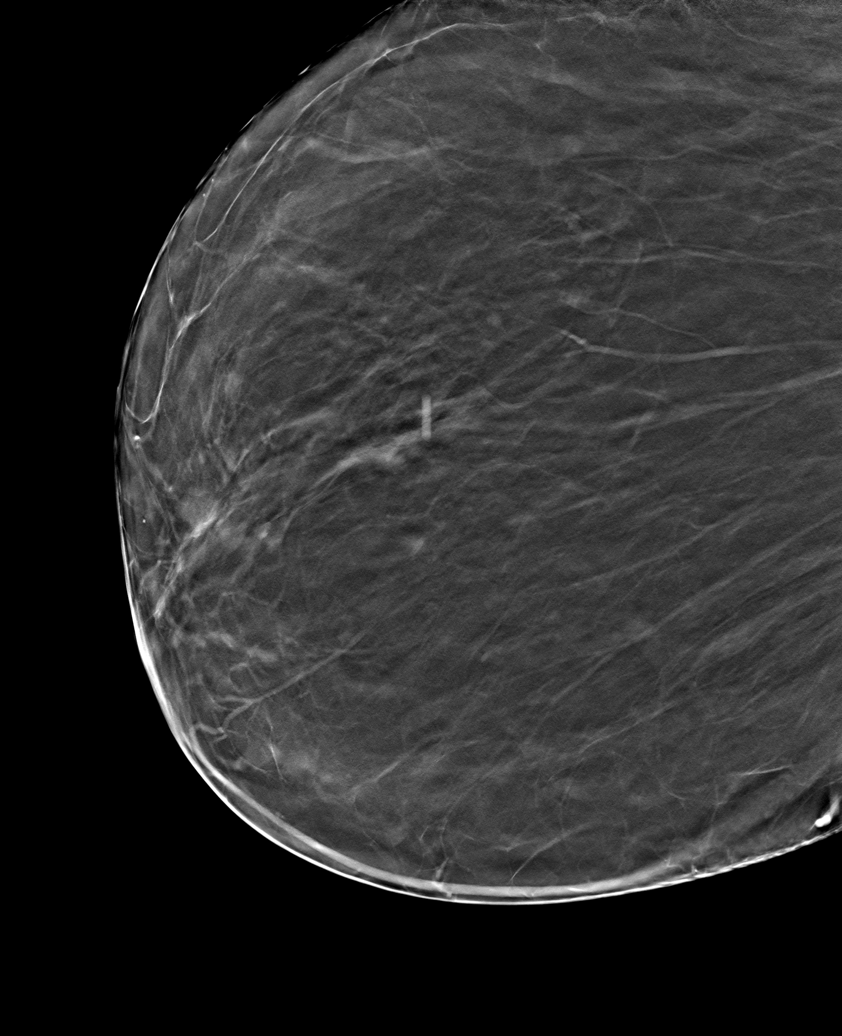

[6 of 30 positions shown; findings below may reference images not displayed]

ACR Breast Density Category b: There are scattered areas of
fibroglandular density.
FINDINGS: Oval circumscribed mass in the outer right breast measures 0.7 cm.
Spot compression tomograms were performed over the palpable area of
concern upper inner right breast with no definite abnormality seen.

Physical examination at site of palpable concern reveals a soft
mobile area of thickening at the approximate 1 o'clock position.

Targeted ultrasound of the right breast was performed. There is an
oval circumscribed isoechoic mass in the right breast at 1 o'clock 8
cm from nipple compatible with a lipoma. This measures 1.5 x 0.5 by
2.3 cm and is similar in appearance when compared to prior
ultrasound from 9244 when remeasured in a similar fashion. Targeted
ultrasound of the outer right breast was performed demonstrating a
cluster of cysts at 9 o'clock 8 cm from nipple measuring 0.6 x 0.3 x
0.7 cm. This corresponds well with the mass seen in the outer right
breast at mammography.
IMPRESSION: Benign-appearing lipoma at site of palpable concern in the upper
slightly inner right breast. No findings of malignancy in the right
breast.

RECOMMENDATION:
Recommend annual screening mammography, due January 2022.

I have discussed the findings and recommendations with the patient.
If applicable, a reminder letter will be sent to the patient
regarding the next appointment.

BI-RADS CATEGORY  2: Benign.
